# Patient Record
Sex: Male | Born: 1955 | Race: White | Hispanic: No | Marital: Married | Smoking: Never smoker
Health system: Southern US, Community
[De-identification: ages and names within clinical notes are randomized; demographics above are authoritative.]

## PROBLEM LIST (undated history)

## (undated) DIAGNOSIS — H269 Unspecified cataract: Secondary | ICD-10-CM

## (undated) DIAGNOSIS — M1990 Unspecified osteoarthritis, unspecified site: Secondary | ICD-10-CM

## (undated) DIAGNOSIS — T7840XA Allergy, unspecified, initial encounter: Secondary | ICD-10-CM

## (undated) HISTORY — PX: REPLACEMENT TOTAL KNEE: SUR1224

## (undated) HISTORY — DX: Allergy, unspecified, initial encounter: T78.40XA

## (undated) HISTORY — PX: TONSILLECTOMY AND ADENOIDECTOMY: SHX28

## (undated) HISTORY — PX: EYE SURGERY: SHX253

## (undated) HISTORY — PX: CHOLECYSTECTOMY: SHX55

## (undated) HISTORY — PX: JOINT REPLACEMENT: SHX530

## (undated) HISTORY — DX: Unspecified cataract: H26.9

## (undated) HISTORY — PX: CATARACT EXTRACTION: SUR2

## (undated) HISTORY — PX: TONSILLECTOMY: SUR1361

## (undated) HISTORY — DX: Unspecified osteoarthritis, unspecified site: M19.90

---

## 2018-12-28 ENCOUNTER — Ambulatory Visit: Payer: Medicare PPO | Admitting: Podiatry

## 2018-12-29 ENCOUNTER — Ambulatory Visit: Payer: Medicare PPO | Admitting: Podiatry

## 2018-12-29 ENCOUNTER — Ambulatory Visit (INDEPENDENT_AMBULATORY_CARE_PROVIDER_SITE_OTHER): Payer: Medicare PPO

## 2018-12-29 ENCOUNTER — Other Ambulatory Visit: Payer: Self-pay

## 2018-12-29 ENCOUNTER — Encounter: Payer: Self-pay | Admitting: Podiatry

## 2018-12-29 VITALS — BP 127/69 | HR 49

## 2018-12-29 DIAGNOSIS — M76821 Posterior tibial tendinitis, right leg: Secondary | ICD-10-CM

## 2018-12-29 DIAGNOSIS — M2141 Flat foot [pes planus] (acquired), right foot: Secondary | ICD-10-CM | POA: Diagnosis not present

## 2018-12-29 DIAGNOSIS — M722 Plantar fascial fibromatosis: Secondary | ICD-10-CM

## 2018-12-29 DIAGNOSIS — M79671 Pain in right foot: Secondary | ICD-10-CM

## 2018-12-29 DIAGNOSIS — M2142 Flat foot [pes planus] (acquired), left foot: Secondary | ICD-10-CM

## 2018-12-30 ENCOUNTER — Encounter: Payer: Self-pay | Admitting: Podiatry

## 2018-12-30 NOTE — Progress Notes (Signed)
Subjective:  Patient ID: Walter Washington, male    DOB: 03-29-1955,  MRN: [id number]  Chief Complaint  Patient presents with  . Foot Pain    right foot painful, torn tendon diagnosed by physician in [place]    63 y.o. male presents with the above complaint.  Patient states he has had been dealing with this pes planus deformity for a while.  The pain has been worsening when he is ambulating and has been on his feet for a while.  This has been going on for past 20 months is more painful with pressure some of the aggravating factors pressure with much exercise.  He has had a podiatrist in [place] that has not diagnosed him with posterior tibial tendon-itis with a possible interstitial tearing.  He was prescribed brace to help heal but has not helped.  MRI was taken in [place] however that was done 1-1/2 years ago and I will be better if he obtain our own MRI.  He denies any other acute complaints.  Patient has failed multiple conservative therapy in [place] by previous podiatrist including offloading modalities injections over-the-counter orthotics.   Review of Systems: Negative except as noted in the HPI. Denies N/V/F/Ch.  History reviewed. No pertinent past medical history. No current outpatient medications on file.  Social History   Tobacco Use  Smoking Status Never Smoker  Smokeless Tobacco Never Used    No Known Allergies Objective:   Vitals:   12/29/18 1205  BP: 127/69  Pulse: (!) 49   There is no height or weight on file to calculate BMI. Constitutional Well developed. Well nourished.  Vascular Dorsalis pedis pulses palpable bilaterally. Posterior tibial pulses palpable bilaterally. Capillary refill normal to all digits.  No cyanosis or clubbing noted. Pedal hair growth normal.  Neurologic Normal speech. Oriented to person, place, and time. Epicritic sensation to light touch grossly present bilaterally.  Dermatologic Nails well groomed and normal in appearance. No  open wounds. No skin lesions.  Orthopedic:  Pain on palpation at the posterior tibial tendon along the course of the tendon and right at the insertion.  Pain with eversion and inversion active and passive range of motion.  No pain at the Achilles tendon at the peroneal tendon or ATFL ligament.  Upon gait examination to many toe signs deformity noted.  Severe collapse in the arch noted.  Unable to recreate the arch with dorsiflexion of the hallux.  Unable to perform single or double heel raise.  Calcaneus eversion noted bilaterally.   Radiographs: 3 views of skeletally mature adult foot.  There is subchondral sclerosis present at the first MPJ.  Mild arthritic changes noted at the first MPJ.  No osteophytes or loosening noted.  Heel spur noted.  Midfoot arthritis noted.  There is decreasing calcaneal inclination angle increase in talar declination angle increase in [name]'s angle.  Increasing Cuboid abduction angle.  No ossicle or osteophyte noted on the medial aspect of the navicular.  There is a mild exostosis present at the navicular base. Assessment:   1. Pes planus of both feet   2. Posterior tibial tendinitis of right lower extremity   3. Pain in right foot    Plan:  Patient was evaluated and treated and all questions answered.  Right foot posterior tibial tendinitis secondary to posterior tibial tendon dysfunction Given the severity of the pes planus deformity with pain along the posterior tibial tendon and at the insertion on the medial aspect of the navicular, patient will benefit from custom-made orthotics  to help address the rigid nature of the pes planus deformity.  I will need to obtain an MRI of the right ankle to evaluate the posterior tibial tendon and the integrity of it.  Pes planus deformity -I explained to the patient the etiology and all the various treatment options associated with pes planus deformity.  I included the options for reconstructive flatfoot given how rigid and how  painful it has been for him for the past couple of years.  Patient elected to hold off on full reconstruction of the flatfoot deformity and would like to pursue posterior tibial debridement with detachment/reinsertion at the base and plantar aspect of the navicular(Kidner).   I will see him back again in 3 weeks in the meantime patient is scheduled to be seen by [facility] for orthotics and he will obtain an MRI.  I will review the MRI during next visit and discussed further surgical care.

## 2019-01-06 ENCOUNTER — Ambulatory Visit: Payer: Medicare PPO | Admitting: Orthotics

## 2019-01-06 ENCOUNTER — Other Ambulatory Visit: Payer: Self-pay

## 2019-01-06 DIAGNOSIS — M76821 Posterior tibial tendinitis, right leg: Secondary | ICD-10-CM

## 2019-01-06 DIAGNOSIS — M2141 Flat foot [pes planus] (acquired), right foot: Secondary | ICD-10-CM

## 2019-01-06 DIAGNOSIS — M2142 Flat foot [pes planus] (acquired), left foot: Secondary | ICD-10-CM

## 2019-01-06 NOTE — Progress Notes (Signed)
Patient presents with history of PTTD/Pes Planus (acquired).  Patient demonstrated medial shift talus/drop navicular, and medial column collapse.   Plan is for CMFO w/ longitudinal arch support, rear foot stability to address eversion.  Fabrication will be with semi rigid/rigid poly pro shell, deep heel seat, wide, padding under spenco cover. Plan on                                      To fabricate. 

## 2019-01-07 ENCOUNTER — Telehealth: Payer: Self-pay

## 2019-01-07 NOTE — Telephone Encounter (Signed)
MRI approved from 01/04/2019 to 02/03/2019 [facility] # [id number] Patient has been notified of approval and will call scheduling to set up own appt to his convenience.

## 2019-01-07 NOTE — Telephone Encounter (Signed)
-----   Message from Felipa Furnace, DPM sent at 12/29/2018  3:48 PM EDT ----- Regarding: MRI right ankle Hi Angie,Would you be able to order an MRI for right ankle for this patient.  Diagnosis will be right tibialis posterior tendon dysfunction.  Please evaluate for tendon insertion pain for posterior tibial.  Thanks

## 2019-01-14 ENCOUNTER — Other Ambulatory Visit: Payer: Self-pay

## 2019-01-14 DIAGNOSIS — M76821 Posterior tibial tendinitis, right leg: Secondary | ICD-10-CM

## 2019-02-02 ENCOUNTER — Ambulatory Visit: Payer: Medicare PPO | Admitting: Podiatry

## 2019-02-09 ENCOUNTER — Ambulatory Visit
Admission: RE | Admit: 2019-02-09 | Discharge: 2019-02-09 | Disposition: A | Discharge status: Home / Self Care | Payer: Medicare PPO | Source: Ambulatory Visit | Attending: Podiatry | Admitting: Podiatry

## 2019-02-09 ENCOUNTER — Other Ambulatory Visit: Payer: Self-pay

## 2019-02-09 DIAGNOSIS — M76821 Posterior tibial tendinitis, right leg: Secondary | ICD-10-CM | POA: Diagnosis present

## 2019-02-15 ENCOUNTER — Encounter: Payer: Self-pay | Admitting: Podiatry

## 2019-02-15 ENCOUNTER — Ambulatory Visit: Payer: Medicare PPO | Admitting: Podiatry

## 2019-02-15 ENCOUNTER — Other Ambulatory Visit: Payer: Self-pay

## 2019-02-15 DIAGNOSIS — M79672 Pain in left foot: Secondary | ICD-10-CM

## 2019-02-15 DIAGNOSIS — M7752 Other enthesopathy of left foot: Secondary | ICD-10-CM

## 2019-02-15 DIAGNOSIS — M2142 Flat foot [pes planus] (acquired), left foot: Secondary | ICD-10-CM

## 2019-02-15 DIAGNOSIS — M76821 Posterior tibial tendinitis, right leg: Secondary | ICD-10-CM

## 2019-02-15 DIAGNOSIS — M79671 Pain in right foot: Secondary | ICD-10-CM

## 2019-02-15 DIAGNOSIS — M2141 Flat foot [pes planus] (acquired), right foot: Secondary | ICD-10-CM

## 2019-02-15 NOTE — Progress Notes (Signed)
Subjective:  Patient ID: Walter Washington, male    DOB: [birth year],  MRN: [id number]  Chief Complaint  Patient presents with  . Foot Pain    pt is here for foot pain of both feet, pt states that he recently got an MRI and is looking to get results    63 y.o. male presents with the above complaint.  Patient states he has had been dealing with this pes planus deformity for a while.  The pain has been worsening when he is ambulating and has been on his feet for a while.  This has been going on for past 20 months is more painful with pressure some of the aggravating factors pressure with much exercise.  He has had a podiatrist in [place] that has not diagnosed him with posterior tibial tendon-itis with a possible interstitial tearing.  He was prescribed brace to help heal but has not helped.  MRI was taken in [place] however that was done 1-1/2 years ago and I will be better if he obtain our own MRI.  He denies any other acute complaints.  Patient has failed multiple conservative therapy in [place] by previous podiatrist including offloading modalities injections over-the-counter orthotics.   Patient is here to follow-up for an MRI results as well as schedule the surgery to address the posterior tibial tendon dysfunction.  Patient states the pain is still there.  He is also here to pick up his orthotics.  His pain is 6 out of 10.  He also has a secondary complaint of pain to the left heel.  This is an acute onset over the last couple weeks.  It may also be due to compensation mechanism.  He denies any other acute complaints at this time.   Review of Systems: Negative except as noted in the HPI. Denies N/V/F/Ch.  No past medical history on file. No current outpatient medications on file.  Social History   Tobacco Use  Smoking Status Never Smoker  Smokeless Tobacco Never Used    No Known Allergies Objective:   There were no vitals filed for this visit. There is no height or weight on file  to calculate BMI. Constitutional Well developed. Well nourished.  Vascular Dorsalis pedis pulses palpable bilaterally. Posterior tibial pulses palpable bilaterally. Capillary refill normal to all digits.  No cyanosis or clubbing noted. Pedal hair growth normal.  Neurologic Normal speech. Oriented to person, place, and time. Epicritic sensation to light touch grossly present bilaterally.  Dermatologic Nails well groomed and normal in appearance. No open wounds. No skin lesions.  Orthopedic:  Pain on palpation at the posterior tibial tendon along the course of the tendon and right at the insertion.  Pain with eversion and inversion active and passive range of motion.  No pain at the Achilles tendon at the peroneal tendon or ATFL ligament.  Upon gait examination to many toe signs deformity noted.  Severe collapse in the arch noted.  Unable to recreate the arch with dorsiflexion of the hallux.  Unable to perform single or double heel raise.  Calcaneus eversion noted bilaterally.   Radiographs:MRI Right foot IMPRESSION: 1. Expansion and longitudinal irregularity of the tibialis posterior tendon compatible with partial tearing and considerable tendinopathy, correlate clinically in assessing for tibialis posterior dysfunction. 2. Thickening of the adjacent superomedial portion of the spring ligament. Questionable tearing of the medioplantar oblique portion of the spring ligament. 3. There is some flattening of the longitudinal arch of the foot potentially indicating pes planus. The anterior talofibular ligament  is grossly intact. 4. Mild peroneus longus tendinopathy adjacent to the distal calcaneus. Mild common peroneus tendon sheath tenosynovitis. 5. Mild flexor hallucis longus tenosynovitis just proximal to the knot of Henry. 6. 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 7. Chondral thinning in the ankle joint and subtalar joint. Degenerative dorsal midfoot  spurring.  Assessment:   1. Pes planus of both feet   2. Pain in right foot   3. Posterior tibial tendinitis of right lower extremity    Plan:  Patient was evaluated and treated and all questions answered.  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  See above for findings of the MRI.  Given the amount of pain that he is having with MRI to correlate the pain of posterior tibial tendon dysfunction, I believe patient will benefit from a surgical intervention to help address the posterior tibial tendon dysfunction with possible Kidner procedure.  Patient agreed to the procedure and would like to proceed with surgical intervention at this time.  Pes planus deformity -I explained to the patient the etiology and all the various treatment options associated with pes planus deformity.  I included the options for reconstructive flatfoot given how rigid and how painful it has been for him for the past couple of years.  Patient elected to hold off on full reconstruction of the flatfoot deformity and would like to pursue posterior tibial debridement with detachment/reinsertion at the base and plantar aspect of the navicular(Kidner).  -Orthotics was dispensed.  Informed surgical risk consent was reviewed and read aloud to the patient.  I reviewed the films.  I have discussed my findings with the patient in great detail.  I have discussed all risks including but not limited to infection, stiffness, scarring, limp, disability, deformity, damage to blood vessels and nerves, numbness, poor healing, need for braces, arthritis, chronic pain, amputation, death.  All benefits and realistic expectations discussed in great detail.  I have made no promises as to the outcome.  I have provided realistic expectations.  I have offered the patient a 2nd opinion, which they have declined and assured me they preferred to proceed  despite the risks -Cam boot dispensed  Left Baxters nerve entrapment/capsulitis -I believe patient will benefit from a steroid injection to help alleviate the acute inflammation that he is having at the medial hindfoot foot/heel.  Patient's pain does not correlate with plantar fasciitis as the pain is more proximal and more medial. A steroid injection was performed at left medial hindfoot using 1% plain Lidocaine and 10 mg of Kenalog. This was well tolerated.

## 2019-02-17 ENCOUNTER — Other Ambulatory Visit: Payer: Medicare PPO | Admitting: Orthotics

## 2019-02-18 ENCOUNTER — Telehealth: Payer: Self-pay | Admitting: *Deleted

## 2019-02-18 NOTE — Telephone Encounter (Signed)
"  I called [facility] about my surgery.  They said they do not have me scheduled for surgery there.  I was told to give them a call to schedule it.  I saw Dr. Posey Pronto earlier this week."  I can schedule your surgery.  Dr. Posey Pronto does surgeries on Mondays.  Do you have a date that you prefer?  "I'd like to do it on March 15, 2019."  I don't have your paperwork at this time but I'll get it scheduled for 03/15/2019 as soon as I get it from Dr. Posey Pronto.  You need to register online, the instructions are in the brochure that was given to you.  "Oh I see, I'll take care of it.  I'll call back next week to verify that I'm scheduled.  You know how things can sometime fall through the cracks."  It's not necessary for you to call back, I'm his only scheduler.  I'll make sure it is scheduled.  (Please send consent forms.)

## 2019-02-19 NOTE — Telephone Encounter (Signed)
Hi [name],  This is the patient for surgery paperwork.   Thanks  [name]

## 2019-03-12 ENCOUNTER — Telehealth: Payer: Self-pay | Admitting: *Deleted

## 2019-03-12 NOTE — Telephone Encounter (Signed)
DOS 03/15/2019 POSTERIOR TENDON DYSFUNCTION - 14709 AND [facility] ADVANCED TENDON - [id number]  [employer]: Eligibility Date - Jun 03, 2018  [facility] - Outpatient  Insurance Type Preferred Provider Organization (PPO)  SURGERY  0 %  Calendar Year  Out of Pocket (Stop Loss) - Health Benefit Plan Coverage  In Network Individual  Insurance Type Preferred Provider Organization (PPO)  $[id number] Calendar Year    Certificate Information  Reference Number NA Status NO ACTION REQUIRED Message For Member contracts which cover this service, no prior authorization is necessary. Please contact [employer] Customer Service at the number on the back of the Member ID card.

## 2019-03-15 DIAGNOSIS — M25774 Osteophyte, right foot: Secondary | ICD-10-CM

## 2019-03-15 DIAGNOSIS — M65871 Other synovitis and tenosynovitis, right ankle and foot: Secondary | ICD-10-CM

## 2019-03-15 MED ORDER — IBUPROFEN 800 MG PO TABS: 800.0000 mg | ORAL_TABLET | Freq: Four times a day (QID) | ORAL | 1 refills | Status: DC | PRN

## 2019-03-15 MED ORDER — OXYCODONE-ACETAMINOPHEN 10-325 MG PO TABS: 1.0000 | ORAL_TABLET | Freq: Four times a day (QID) | ORAL | 0 refills | Status: AC | PRN

## 2019-03-15 NOTE — Addendum Note (Signed)
Addended by: Nicholes Rough on: 03/15/2019 07:50 AM   Modules accepted: Orders

## 2019-03-23 ENCOUNTER — Ambulatory Visit (INDEPENDENT_AMBULATORY_CARE_PROVIDER_SITE_OTHER): Payer: Medicare PPO | Admitting: Podiatry

## 2019-03-23 ENCOUNTER — Other Ambulatory Visit: Payer: Self-pay

## 2019-03-23 ENCOUNTER — Encounter: Payer: Self-pay | Admitting: Podiatry

## 2019-03-23 DIAGNOSIS — M76821 Posterior tibial tendinitis, right leg: Secondary | ICD-10-CM

## 2019-03-23 DIAGNOSIS — M79671 Pain in right foot: Secondary | ICD-10-CM

## 2019-03-23 NOTE — Progress Notes (Signed)
  Subjective:  Patient ID: Walter Washington, male    DOB: 1955/05/21,  MRN: [id number]  Chief Complaint  Patient presents with  . Routine Post Op     POV#1 DOS 03/15/2019 POSTERIOR TENDON REPAIR AND KIDNER ADVANCED TENDON RT    64 y.o. male returns for post-op check.  Patient states is doing really well.  He is overall very happy with the surgery.  He does not have any pain.  He barely takes any of his pain medication.  He has been nonambulatory with cam boot on.  Patient shows no signs of infection.  He would like to know when we can take the staples out.  He has been keeping his bandages nice dry clean and intact.  Review of Systems: Negative except as noted in the HPI. Denies N/V/F/Ch.  No past medical history on file.  Current Outpatient Medications:  .  ibuprofen (ADVIL) 800 MG tablet, Take 1 tablet (800 mg total) by mouth every 6 (six) hours as needed., Disp: 60 tablet, Rfl: 1 .  oxyCODONE-acetaminophen (PERCOCET) 10-325 MG tablet, Take 1 tablet by mouth every 6 (six) hours as needed for up to 8 days for pain., Disp: 30 tablet, Rfl: 0  Social History   Tobacco Use  Smoking Status Never Smoker  Smokeless Tobacco Never Used    No Known Allergies Objective:  There were no vitals filed for this visit. There is no height or weight on file to calculate BMI. Constitutional Well developed. Well nourished.  Vascular Foot warm and well perfused. Capillary refill normal to all digits.   Neurologic Normal speech. Oriented to person, place, and time. Epicritic sensation to light touch grossly present bilaterally.  Dermatologic Skin healing well without signs of infection. Skin edges well coapted without signs of infection.  Orthopedic: Tenderness to palpation noted about the surgical site.   Radiographs: None Assessment:   1. Posterior tibial tendinitis of right lower extremity   2. Pain in right foot    Plan:  Patient was evaluated and treated and all questions answered.  S/p  foot surgery right -Progressing as expected post-operatively. -XR: None.  I will plan on getting the x-rays during next visit. -WB Status: Nonweightbearing in cam boot -Sutures: Staples are intact without any signs of dehiscence.  No signs of clinical infection noted.  I plan to remove the staples during next visit. -Medications: None -Foot redressed.  Betadine waiting wet-to-dry dressing changes to the incision.  No follow-ups on file.

## 2019-03-29 ENCOUNTER — Ambulatory Visit (INDEPENDENT_AMBULATORY_CARE_PROVIDER_SITE_OTHER): Payer: Medicare PPO | Admitting: Podiatry

## 2019-03-29 ENCOUNTER — Other Ambulatory Visit: Payer: Self-pay

## 2019-03-29 ENCOUNTER — Ambulatory Visit (INDEPENDENT_AMBULATORY_CARE_PROVIDER_SITE_OTHER): Payer: Medicare PPO

## 2019-03-29 ENCOUNTER — Ambulatory Visit: Payer: Medicare PPO

## 2019-03-29 DIAGNOSIS — M79671 Pain in right foot: Secondary | ICD-10-CM

## 2019-03-29 DIAGNOSIS — M76821 Posterior tibial tendinitis, right leg: Secondary | ICD-10-CM

## 2019-03-29 DIAGNOSIS — Z9889 Other specified postprocedural states: Secondary | ICD-10-CM

## 2019-03-30 ENCOUNTER — Encounter: Payer: Self-pay | Admitting: Podiatry

## 2019-03-30 NOTE — Progress Notes (Signed)
  Subjective:  Patient ID: Walter Washington, male    DOB: 10-09-1955,  MRN: [id number]  Chief Complaint  Patient presents with  . Routine Post Op    POV#2 DOS 03/15/2019 POSTERIOR TENDON REPAIR AND [facility] ADVANCED TENDON RT, staples have been removed, pt shows no signs of infection, and has no pain.    64 y.o. male returns for post-op check.  Patient states is doing really well.  He is overall very happy with the surgery.  He does not have any pain.  He barely takes any of his pain medication.  He has been nonambulatory with cam boot on.  Patient shows no signs of infection.    Review of Systems: Negative except as noted in the HPI. Denies N/V/F/Ch.  No past medical history on file.  Current Outpatient Medications:  .  ibuprofen (ADVIL) 800 MG tablet, Take 1 tablet (800 mg total) by mouth every 6 (six) hours as needed., Disp: 60 tablet, Rfl: 1  Social History   Tobacco Use  Smoking Status Never Smoker  Smokeless Tobacco Never Used    No Known Allergies Objective:  There were no vitals filed for this visit. There is no height or weight on file to calculate BMI. Constitutional Well developed. Well nourished.  Vascular Foot warm and well perfused. Capillary refill normal to all digits.   Neurologic Normal speech. Oriented to person, place, and time. Epicritic sensation to light touch grossly present bilaterally.  Dermatologic Skin healing well without signs of infection. Skin edges well coapted without signs of infection.  Staples intact  Orthopedic:  No tenderness to palpation noted about the surgical site.   Radiographs: 2 views of skeletally mature adult foot x-rays were taken: Good correction noted.  Hardware is intact without any signs of loosening.  Postsurgical exostectomy of the navicular was performed. Assessment:   1. Posterior tibial tendinitis of right lower extremity    Plan:  Patient was evaluated and treated and all questions answered.  S/p foot surgery  right -Progressing as expected post-operatively. -XR: None.  I will plan on getting the x-rays during next visit. -WB Status: Nonweightbearing in cam boot -Sutures: Staples were taken out.  No clinical signs of dehiscence noted. -Medications: None -Foot redressed.  Betadine waiting wet-to-dry dressing changes to the incision.  No follow-ups on file.

## 2019-04-11 ENCOUNTER — Ambulatory Visit: Payer: Medicare PPO | Attending: Internal Medicine

## 2019-04-11 DIAGNOSIS — Z23 Encounter for immunization: Secondary | ICD-10-CM | POA: Insufficient documentation

## 2019-04-11 NOTE — Progress Notes (Signed)
   Covid-19 Vaccination Clinic  Name:  Levi Klaiber    MRN: [id number] DOB: [birth year]  04/11/2019  Mr. Decandia was observed post Covid-19 immunization for 15 minutes without incidence. He was provided with Vaccine Information Sheet and instruction to access the V-Safe system.   Mr. Mendiola was instructed to call 911 with any severe reactions post vaccine: Marland Kitchen Difficulty breathing  . Swelling of your face and throat  . A fast heartbeat  . A bad rash all over your body  . Dizziness and weakness    Immunizations Administered    Name Date Dose VIS Date Route   Pfizer COVID-19 Vaccine 04/11/2019  5:18 PM 0.3 mL 02/12/2019 Intramuscular   Manufacturer: [employer], [employer]   Lot: [id number]   NDC: [address]-2

## 2019-04-12 ENCOUNTER — Ambulatory Visit (INDEPENDENT_AMBULATORY_CARE_PROVIDER_SITE_OTHER): Payer: Medicare PPO

## 2019-04-12 ENCOUNTER — Ambulatory Visit (INDEPENDENT_AMBULATORY_CARE_PROVIDER_SITE_OTHER): Payer: Medicare PPO | Admitting: Podiatry

## 2019-04-12 ENCOUNTER — Other Ambulatory Visit: Payer: Self-pay

## 2019-04-12 DIAGNOSIS — M76821 Posterior tibial tendinitis, right leg: Secondary | ICD-10-CM | POA: Diagnosis not present

## 2019-04-12 DIAGNOSIS — Z9889 Other specified postprocedural states: Secondary | ICD-10-CM

## 2019-04-13 ENCOUNTER — Encounter: Payer: Self-pay | Admitting: Podiatry

## 2019-04-13 NOTE — Progress Notes (Signed)
  Subjective:  Patient ID: Walter Washington, male    DOB: 1955/03/16,  MRN: [id number]  Chief Complaint  Patient presents with  . Routine Post Op    POV#3 DOS 03/15/2019 POSTERIOR TENDON REPAIR AND [facility] ADVANCED TENDON RT, pt shows no signs of infection, pt also states that he is doing a lot better and is looking to get out of the boot.    64 y.o. male returns for post-op check.  Patient states is doing really well.  He is overall very happy with the surgery.  He does not have any pain.  He barely takes any of his pain medication.  He has been n weightbearing with crutches.  He denies any other acute complaints.  He does not have any pain at this time.  Review of Systems: Negative except as noted in the HPI. Denies N/V/F/Ch.  No past medical history on file.  Current Outpatient Medications:  .  ibuprofen (ADVIL) 800 MG tablet, Take 1 tablet (800 mg total) by mouth every 6 (six) hours as needed., Disp: 60 tablet, Rfl: 1  Social History   Tobacco Use  Smoking Status Never Smoker  Smokeless Tobacco Never Used    No Known Allergies Objective:  There were no vitals filed for this visit. There is no height or weight on file to calculate BMI. Constitutional Well developed. Well nourished.  Vascular Foot warm and well perfused. Capillary refill normal to all digits.   Neurologic Normal speech. Oriented to person, place, and time. Epicritic sensation to light touch grossly present bilaterally.  Dermatologic  skin completely reepithelialized.  No pain on palpation.  Orthopedic:  No tenderness to palpation noted about the surgical site.   Radiographs: 2 views of skeletally mature adult foot x-rays were taken: Good correction noted.  Hardware is intact without any signs of loosening.  Postsurgical exostectomy of the navicular was performed. Assessment:   1. Posterior tibial tendinitis of right lower extremity   2. Status post foot surgery    Plan:  Patient was evaluated and treated  and all questions answered.  S/p foot surgery right -Progressing as expected post-operatively. -XR: See above -WB Status: Weightbearing as tolerated in cam boot. -Sutures: Staples were taken out.  No clinical signs of dehiscence noted. -Medications: None -I have asked the patient to start ambulating with a cam boot on as he is almost 4 weeks out.  He has also been healing at a faster rate than normal therefore I feel comfortable allowing the patient to start weightbearing slowly with a cam boot on.  I will plan on transitioning the patient to surgical shoe during next visit.  Patient states that if he is doing well overall he may transition himself into a surgical shoe.  I have asked the patient to do so very carefully and if there is any pain associated I would like for him to place himself back in the cam boot.  He states understanding and will do so  No follow-ups on file.

## 2019-05-02 ENCOUNTER — Ambulatory Visit: Payer: Medicare PPO

## 2019-05-04 ENCOUNTER — Other Ambulatory Visit: Payer: Self-pay

## 2019-05-04 ENCOUNTER — Ambulatory Visit (INDEPENDENT_AMBULATORY_CARE_PROVIDER_SITE_OTHER): Payer: Medicare PPO | Admitting: Podiatry

## 2019-05-04 DIAGNOSIS — M79671 Pain in right foot: Secondary | ICD-10-CM

## 2019-05-04 DIAGNOSIS — M76821 Posterior tibial tendinitis, right leg: Secondary | ICD-10-CM

## 2019-05-04 DIAGNOSIS — Z9889 Other specified postprocedural states: Secondary | ICD-10-CM

## 2019-05-05 ENCOUNTER — Telehealth: Payer: Self-pay

## 2019-05-05 ENCOUNTER — Encounter: Payer: Self-pay | Admitting: Podiatry

## 2019-05-05 DIAGNOSIS — Z9889 Other specified postprocedural states: Secondary | ICD-10-CM

## 2019-05-05 DIAGNOSIS — M76821 Posterior tibial tendinitis, right leg: Secondary | ICD-10-CM

## 2019-05-05 NOTE — Telephone Encounter (Signed)
-----   Message from Candelaria Stagers, DPM sent at 05/05/2019  7:55 AM EST ----- Regarding: Physical therapy right Hi Angie,  Can you schedule this patient to undergo physical therapy anywhere locally in [place]. Patient needs general conditioning to the surgical foot on the right side as well as gait training.  Let me know if you need anything else thanks

## 2019-05-05 NOTE — Progress Notes (Signed)
  Subjective:  Patient ID: Walter Washington, male    DOB: 1955-10-26,  MRN: [id number]  Chief Complaint  Patient presents with  . Routine Post Op    POV#3 DOS 03/15/2019 POSTERIOR TENDON REPAIR AND [facility] ADVANCED TENDON RT, pt states that he is still feeling pain since the last time he was here, pain is now located on the bottom of the arche    64 y.o. male returns for post-op check. Patient has had a hard time difficulty transitioning to regular sneakers. Patient states that he has pain not along the incision or the surgery however in the bottom of the foot. Patient has been ambulating with a cam boot and has attempted to transition into sneakers. However after some period of time the pain comes back in the arch of the foot and has to place himself back in the boot. He denies any other acute complaints. He would like to know if there is anything that can be done for this.  Review of Systems: Negative except as noted in the HPI. Denies N/V/F/Ch.  No past medical history on file.  Current Outpatient Medications:  .  ibuprofen (ADVIL) 800 MG tablet, Take 1 tablet (800 mg total) by mouth every 6 (six) hours as needed., Disp: 60 tablet, Rfl: 1  Social History   Tobacco Use  Smoking Status Never Smoker  Smokeless Tobacco Never Used    No Known Allergies Objective:  There were no vitals filed for this visit. There is no height or weight on file to calculate BMI. Constitutional Well developed. Well nourished.  Vascular Foot warm and well perfused. Capillary refill normal to all digits.   Neurologic Normal speech. Oriented to person, place, and time. Epicritic sensation to light touch grossly present bilaterally.  Dermatologic  skin completely reepithelialized.  No pain on palpation. Pain along the arch of the foot mild on palpation at this time.  Orthopedic:  No tenderness to palpation noted about the surgical site.   Radiographs: None Assessment:   1. Status post foot surgery     2. Posterior tibial tendinitis of right lower extremity   3. Pain in right foot    Plan:  Patient was evaluated and treated and all questions answered.  S/p foot surgery right -Progressing as expected post-operatively. -XR: See above -WB Status: Weightbearing as tolerated in cam boot. -Sutures: Staples were taken out.  No clinical signs of dehiscence noted. -Medications: None -Patient has started transitioning into a flat shoe however he is not able to do so completely. Patient states that there is pain associated with it once he comes out of the cam boot. When he is in the cam boot he does not have any pain. However once he comes out of the cam boot he has a lot of pain in the arch of the foot. Patient states that he would like to know if there is doing something wrong if there is any kind of altercation that we can address. I have asked the patient to slowly start progressing into regular sneakers with orthotics. I have asked him to not go 100% in the orthotics and begin transitioning/breaking the orthotics in. Patient states understanding and will start doing that. -I also believe patient will benefit from physical therapy to help address the overall deconditioning of the right lower extremity as well as gait training. Patient states understanding and agrees with the plan  No follow-ups on file.

## 2019-05-05 NOTE — Telephone Encounter (Signed)
Referral has been entered in chart for PT at [facility]

## 2019-05-06 ENCOUNTER — Ambulatory Visit: Payer: Medicare PPO | Attending: Internal Medicine

## 2019-05-06 DIAGNOSIS — Z23 Encounter for immunization: Secondary | ICD-10-CM | POA: Insufficient documentation

## 2019-05-06 NOTE — Progress Notes (Signed)
   Covid-19 Vaccination Clinic  Name:  Shalamar Crays    MRN: [id number] DOB: Jan 13, 1956  05/06/2019  Mr. Thane was observed post Covid-19 immunization for 15 minutes without incident. He was provided with Vaccine Information Sheet and instruction to access the V-Safe system.   Mr. Zentner was instructed to call 911 with any severe reactions post vaccine: Marland Kitchen Difficulty breathing  . Swelling of face and throat  . A fast heartbeat  . A bad rash all over body  . Dizziness and weakness   Immunizations Administered    Name Date Dose VIS Date Route   Pfizer COVID-19 Vaccine 05/06/2019  2:08 PM 0.3 mL 02/12/2019 Intramuscular   Manufacturer: [employer], [employer]   Lot: [id number]   NDC: [address]-2

## 2019-05-26 ENCOUNTER — Ambulatory Visit: Payer: Medicare PPO

## 2019-06-01 ENCOUNTER — Encounter: Payer: Self-pay | Admitting: Podiatry

## 2019-06-01 ENCOUNTER — Ambulatory Visit (INDEPENDENT_AMBULATORY_CARE_PROVIDER_SITE_OTHER): Payer: Medicare PPO | Admitting: Podiatry

## 2019-06-01 ENCOUNTER — Other Ambulatory Visit: Payer: Self-pay

## 2019-06-01 DIAGNOSIS — M79671 Pain in right foot: Secondary | ICD-10-CM

## 2019-06-01 DIAGNOSIS — M76821 Posterior tibial tendinitis, right leg: Secondary | ICD-10-CM

## 2019-06-01 DIAGNOSIS — Z9889 Other specified postprocedural states: Secondary | ICD-10-CM

## 2019-06-01 NOTE — Progress Notes (Signed)
  Subjective:  Patient ID: Walter Washington, male    DOB: 03-23-1955,  MRN: [id number]  Chief Complaint  Patient presents with  . Routine Post Op    OV#5 DOS 03/15/2019 POSTERIOR TENDON REPAIR AND [facility] ADVANCED TENDON RT, pt is doing a lot better, pt puts pain scale as a 2 out of 53    64 y.o. male returns for post-op check.   Patient states that he is doing a lot better.  He is pain is scale is 2 out of 10 at its worst.  Patient states the incision has completely healed.  He stopped going to physical therapy as he got as much as he could out of it.  He denies any other acute complaints.  He would like to know if he can go back to playing golf as well as continue his daily activities.  Review of Systems: Negative except as noted in the HPI. Denies N/V/F/Ch.  No past medical history on file.  Current Outpatient Medications:  .  ibuprofen (ADVIL) 800 MG tablet, Take 1 tablet (800 mg total) by mouth every 6 (six) hours as needed., Disp: 60 tablet, Rfl: 1 .  predniSONE (DELTASONE) 50 MG tablet, , Disp: , Rfl:   Social History   Tobacco Use  Smoking Status Never Smoker  Smokeless Tobacco Never Used    No Known Allergies Objective:  There were no vitals filed for this visit. There is no height or weight on file to calculate BMI. Constitutional Well developed. Well nourished.  Vascular Foot warm and well perfused. Capillary refill normal to all digits.   Neurologic Normal speech. Oriented to person, place, and time. Epicritic sensation to light touch grossly present bilaterally.  Dermatologic  skin completely reepithelialized.  No pain on palpation. Now pain along the arch of the foot mild on palpation at this time.  Orthopedic:  No tenderness to palpation noted about the surgical site.   Radiographs: None Assessment:   1. Status post foot surgery   2. Posterior tibial tendinitis of right lower extremity   3. Pain in right foot    Plan:  Patient was evaluated and treated and  all questions answered.  S/p foot surgery right -Progressing as expected post-operatively. -XR: See above -WB Status: Weightbearing as tolerated in cam boot. -Sutures: Staples were taken out.  No clinical signs of dehiscence noted. -Medications: None -Patient has clinically improved drastically.  Patient has been ambulating in regular sneakers without any pain.  Patient has been wearing his orthotics which appear to be comfortable and controlling the motion and supporting the arch of the foot.  Patient can resume normal activities without any restrictions at this time.  I have discussed with the patient that if there is any foot and ankle issues arises in the future come back and see me.  No follow-ups on file.

## 2019-12-30 ENCOUNTER — Other Ambulatory Visit: Payer: Self-pay

## 2019-12-30 ENCOUNTER — Ambulatory Visit: Payer: Medicare PPO | Admitting: Podiatry

## 2019-12-30 ENCOUNTER — Encounter: Payer: Self-pay | Admitting: Podiatry

## 2019-12-30 ENCOUNTER — Ambulatory Visit (INDEPENDENT_AMBULATORY_CARE_PROVIDER_SITE_OTHER): Payer: Medicare PPO

## 2019-12-30 DIAGNOSIS — M7732 Calcaneal spur, left foot: Secondary | ICD-10-CM

## 2019-12-30 DIAGNOSIS — M722 Plantar fascial fibromatosis: Secondary | ICD-10-CM | POA: Diagnosis not present

## 2019-12-30 NOTE — Progress Notes (Signed)
  Subjective:  Patient ID: Walter Washington, male    DOB: 09/23/1955,  MRN: [id number]  Chief Complaint  Patient presents with  . Foot Pain    Patient presents today for left heel pain x 1 year off and on, but has become worse over the past several months.      64 y.o. male presents with the above complaint.  Patient presents with complaint of left heel pain that has been going on for about a year on and off.  Patient had injections in the past which helped a lot.  He states it started to act back up again.  He would like to know if he could do another injection as well as discuss treatments for plantar fasciitis to the left side.  His right foot is healing well and does not have much pain.  He has been ambulating with orthotics he denies any other acute complaints.   Review of Systems: Negative except as noted in the HPI. Denies N/V/F/Ch.  No past medical history on file.  Current Outpatient Medications:  .  naproxen (NAPROSYN) 500 MG tablet, Take 500 mg by mouth 2 (two) times daily with a meal., Disp: , Rfl:   Social History   Tobacco Use  Smoking Status Never Smoker  Smokeless Tobacco Never Used    No Known Allergies Objective:  There were no vitals filed for this visit. There is no height or weight on file to calculate BMI. Constitutional Well developed. Well nourished.  Vascular Dorsalis pedis pulses palpable bilaterally. Posterior tibial pulses palpable bilaterally. Capillary refill normal to all digits.  No cyanosis or clubbing noted. Pedal hair growth normal.  Neurologic Normal speech. Oriented to person, place, and time. Epicritic sensation to light touch grossly present bilaterally.  Dermatologic Nails well groomed and normal in appearance. No open wounds. No skin lesions.  Orthopedic: Normal joint ROM without pain or crepitus bilaterally. No visible deformities. Tender to palpation at the calcaneal tuber left. No pain with calcaneal squeeze left. Ankle ROM  diminished range of motion left. Silfverskiold Test: positive left.   Radiographs: Taken and reviewed. No acute fractures or dislocations. No evidence of stress fracture.  Plantar heel spur present. Posterior heel spur present.   Assessment:   1. Plantar fasciitis of left foot   2. Heel spur, left    Plan:  Patient was evaluated and treated and all questions answered.  Plantar Fasciitis, left - XR reviewed as above.  - Educated on icing and stretching. Instructions given.  - Injection delivered to the plantar fascia as below. - DME: Plantar Fascial Brace - Pharmacologic management: None  Procedure: Injection Tendon/Ligament Location: Left plantar fascia at the glabrous junction; medial approach. Skin Prep: alcohol Injectate: 0.5 cc 0.5% marcaine plain, 0.5 cc of 1% Lidocaine, 0.5 cc kenalog 10. Disposition: Patient tolerated procedure well. Injection site dressed with a band-aid.  No follow-ups on file.

## 2020-02-01 ENCOUNTER — Ambulatory Visit: Payer: Medicare PPO | Admitting: Podiatry

## 2020-02-08 ENCOUNTER — Encounter: Payer: Self-pay | Admitting: Podiatry

## 2020-02-08 ENCOUNTER — Other Ambulatory Visit: Payer: Self-pay

## 2020-02-08 ENCOUNTER — Ambulatory Visit (INDEPENDENT_AMBULATORY_CARE_PROVIDER_SITE_OTHER): Payer: Medicare PPO | Admitting: Podiatry

## 2020-02-08 DIAGNOSIS — M722 Plantar fascial fibromatosis: Secondary | ICD-10-CM | POA: Diagnosis not present

## 2020-02-08 NOTE — Progress Notes (Signed)
  Subjective:  Patient ID: Walter Washington, male    DOB: [birth year],  MRN: [id number]  Chief Complaint  Patient presents with  . Plantar Fasciitis    "its doing alot better"    64 y.o. male presents with the above complaint.  Patient presents with follow-up of left plantar fasciitis.  Patient states that he is 98% improved.  He states the injection worked well.  The bracing is doing really well.  He denies any other acute complaints.  He would like to know if he can have a brace for the right side.   Review of Systems: Negative except as noted in the HPI. Denies N/V/F/Ch.  History reviewed. No pertinent past medical history.  Current Outpatient Medications:  .  naproxen (NAPROSYN) 500 MG tablet, Take 500 mg by mouth 2 (two) times daily with a meal., Disp: , Rfl:   Social History   Tobacco Use  Smoking Status Never Smoker  Smokeless Tobacco Never Used    No Known Allergies Objective:  There were no vitals filed for this visit. There is no height or weight on file to calculate BMI. Constitutional Well developed. Well nourished.  Vascular Dorsalis pedis pulses palpable bilaterally. Posterior tibial pulses palpable bilaterally. Capillary refill normal to all digits.  No cyanosis or clubbing noted. Pedal hair growth normal.  Neurologic Normal speech. Oriented to person, place, and time. Epicritic sensation to light touch grossly present bilaterally.  Dermatologic Nails well groomed and normal in appearance. No open wounds. No skin lesions.  Orthopedic: Normal joint ROM without pain or crepitus bilaterally. No visible deformities. No tender to palpation at the calcaneal tuber left. No pain with calcaneal squeeze left. Ankle ROM diminished range of motion left. Silfverskiold Test: positive left.   Radiographs: Taken and reviewed. No acute fractures or dislocations. No evidence of stress fracture.  Plantar heel spur present. Posterior heel spur present.   Assessment:   1.  Plantar fasciitis of left foot    Plan:  Patient was evaluated and treated and all questions answered.  Plantar Fasciitis, left - Clinically resolved with plantar fascial brace and steroid injection.  Patient states that he is doing a lot better given that his pain is completely resolved I discussed with him to continue losing orthotics and can wean off the brace as needed.  He states understanding and will do so. -I will see him back if any other foot and ankle issues arise in the future.  No follow-ups on file.

## 2020-06-27 ENCOUNTER — Ambulatory Visit: Payer: Medicare PPO | Admitting: Podiatry

## 2020-06-27 ENCOUNTER — Encounter: Payer: Self-pay | Admitting: Podiatry

## 2020-06-27 ENCOUNTER — Other Ambulatory Visit: Payer: Self-pay

## 2020-06-27 DIAGNOSIS — M7730 Calcaneal spur, unspecified foot: Secondary | ICD-10-CM | POA: Diagnosis not present

## 2020-06-27 DIAGNOSIS — M722 Plantar fascial fibromatosis: Secondary | ICD-10-CM | POA: Diagnosis not present

## 2020-06-27 NOTE — Progress Notes (Signed)
  Subjective:  Patient ID: Walter Washington, male    DOB: [birth year],  MRN: [id number]  Chief Complaint  Patient presents with  . Plantar Fasciitis    Patient presents today for flare up plantar fasciitis bilat heels x 1-2 months   He requests injections bilat    65 y.o. male presents with the above complaint.  Patient presents for follow-up of bilateral plantar fasciitis left being greater than right side.  Patient states it recurred again.  He states the injection does last him a good amount of time he would like to do another injection.  He denies any other acute complaints.  Review of Systems: Negative except as noted in the HPI. Denies N/V/F/Ch.  History reviewed. No pertinent past medical history.  Current Outpatient Medications:  .  naproxen (NAPROSYN) 500 MG tablet, Take 500 mg by mouth 2 (two) times daily with a meal., Disp: , Rfl:   Social History   Tobacco Use  Smoking Status Never Smoker  Smokeless Tobacco Never Used    No Known Allergies Objective:  There were no vitals filed for this visit. There is no height or weight on file to calculate BMI. Constitutional Well developed. Well nourished.  Vascular Dorsalis pedis pulses palpable bilaterally. Posterior tibial pulses palpable bilaterally. Capillary refill normal to all digits.  No cyanosis or clubbing noted. Pedal hair growth normal.  Neurologic Normal speech. Oriented to person, place, and time. Epicritic sensation to light touch grossly present bilaterally.  Dermatologic Nails well groomed and normal in appearance. No open wounds. No skin lesions.  Orthopedic: Normal joint ROM without pain or crepitus bilaterally. No visible deformities. Tender to palpation at the calcaneal tuber bilateral No pain with calcaneal squeeze bilateral Ankle ROM diminished range of motion l bilateral Silfverskiold Test: positive bilateral   Radiographs: Taken and reviewed. No acute fractures or dislocations. No evidence of  stress fracture.  Plantar heel spur present. Posterior heel spur present.   Assessment:   1. Plantar fasciitis of left foot   2. Heel spur, unspecified laterality   3. Plantar fasciitis of right foot    Plan:  Patient was evaluated and treated and all questions answered.  Plantar Fasciitis, bilateral with underlying heel spur - XR reviewed as above.  - Educated on icing and stretching. Instructions given.  - Injection delivered to the plantar fascia as below. - DME: Continue wearing plantar Fascial Brace - Pharmacologic management: None  Procedure: Injection Tendon/Ligament Location: Bilateral plantar fascia at the glabrous junction; medial approach. Skin Prep: alcohol Injectate: 0.5 cc 0.5% marcaine plain, 0.5 cc of 1% Lidocaine, 0.5 cc kenalog 10. Disposition: Patient tolerated procedure well. Injection site dressed with a band-aid.  No follow-ups on file.

## 2021-06-16 IMAGING — MR MR ANKLE*R* W/O CM
5 series · 40 of 40 positions shown · non-contrast
Comparison: Radiographs from 12/29/2018

CLINICAL DATA: Popping injury of the ankle well on a treadmill over
1 year ago. Worsening medial ankle pain.

EXAM:
MRI OF THE RIGHT ANKLE WITHOUT CONTRAST
TECHNIQUE: Multiplanar, multisequence MR imaging of the ankle was performed. No
intravenous contrast was administered.

[Series 3: PD fat-sat · axial · right · 3.0mm · 0.50mm/px · z∈[-132,+8]mm · 10 of 36 slices shown]
[im 1/36]
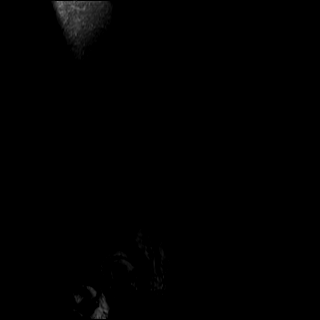
[im 4/36]
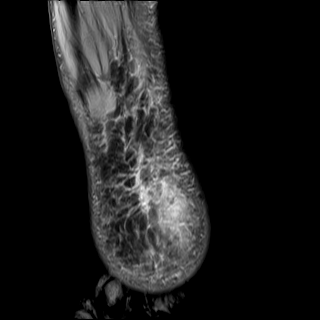
[im 8/36]
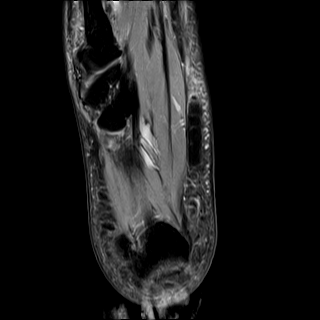
[im 12/36]
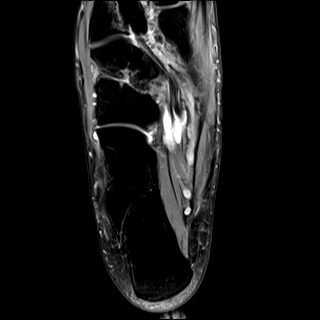
[im 16/36]
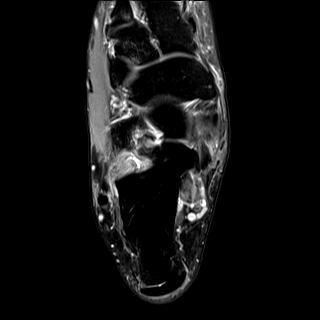
[im 20/36]
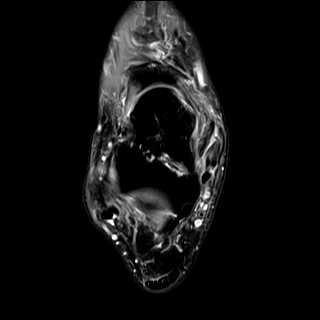
[im 24/36]
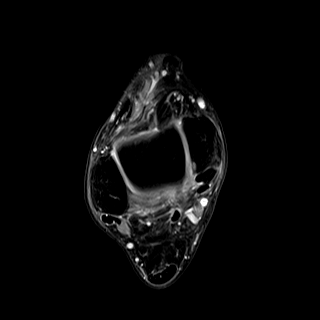
[im 28/36]
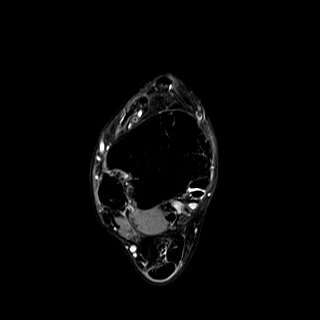
[im 32/36]
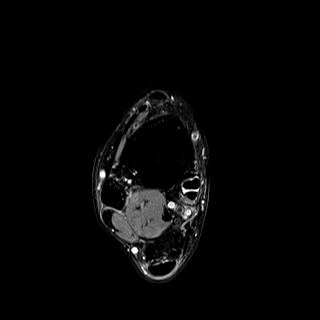
[im 36/36]
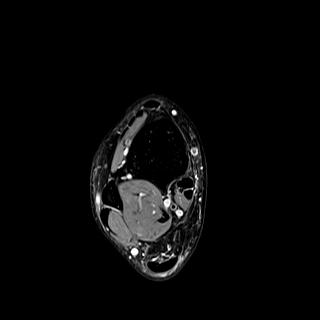

[Series 4: T2 fat-sat · axial · right · 3.0mm · 0.50mm/px · z∈[-132,+8]mm · 9 of 36 slices shown (1 of 2)]
[im 1/36]
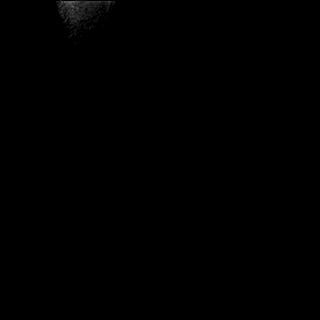
[im 5/36]
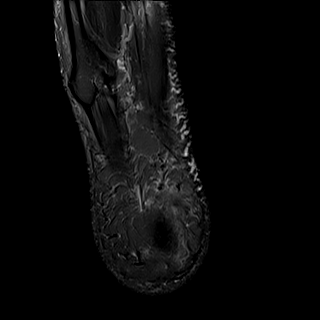
[im 9/36]
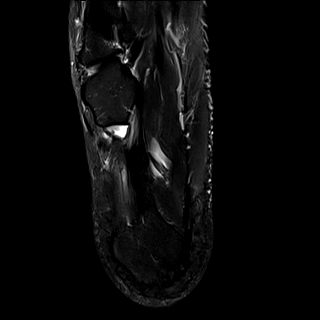
[im 14/36]
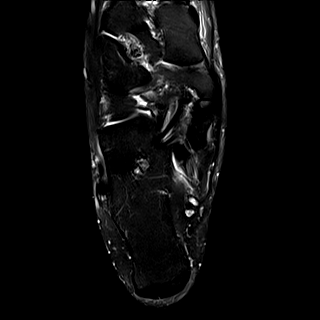
[im 18/36]
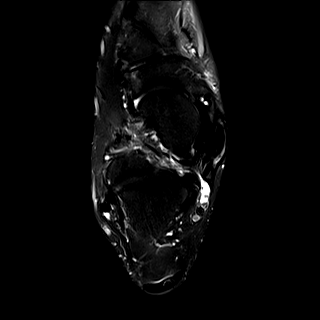
[im 22/36]
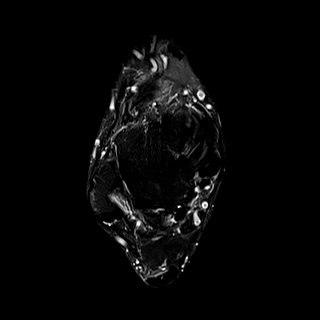
[im 27/36]
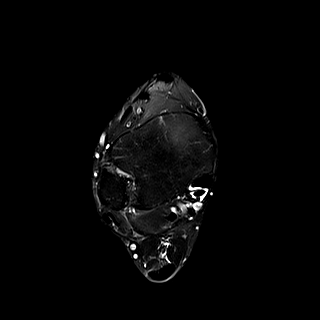
[im 31/36]
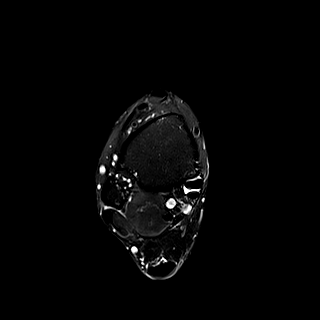
[im 36/36]
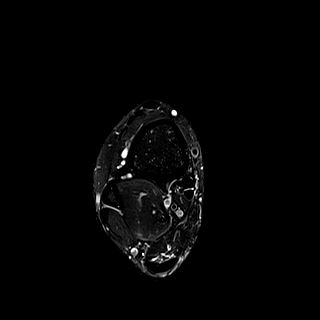

[Series 5: T2 fat-sat · coronal · right · 3.0mm · 0.62mm/px · 11 of 40 slices shown (2 of 2)]
[im 1/40]
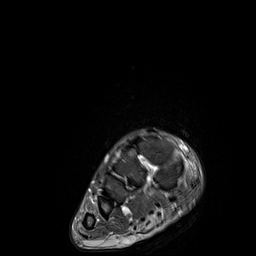
[im 4/40]
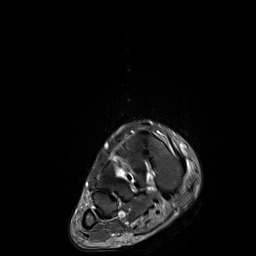
[im 8/40]
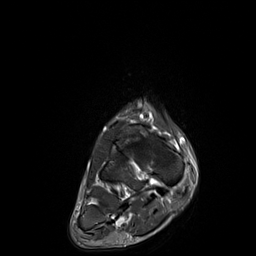
[im 12/40]
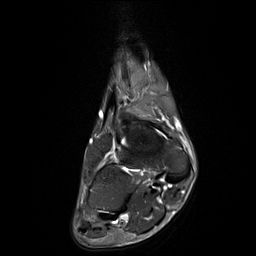
[im 16/40]
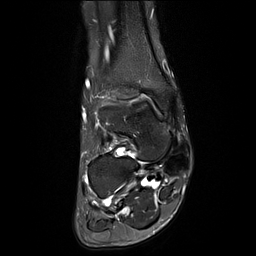
[im 20/40]
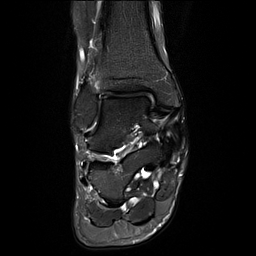
[im 24/40]
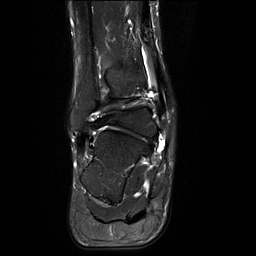
[im 28/40]
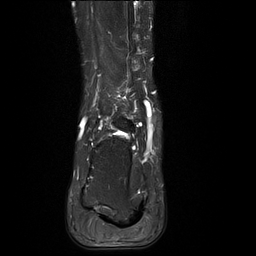
[im 32/40]
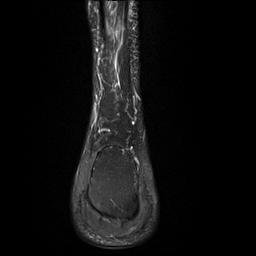
[im 36/40]
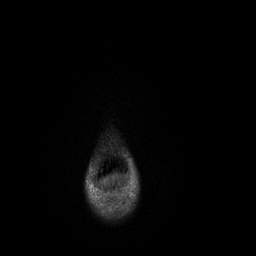
[im 40/40]
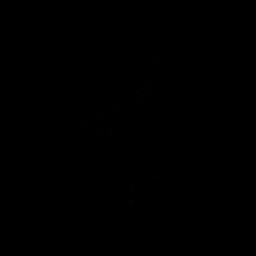

[Series 6: T1 · sagittal · right · 4.0mm · 0.70mm/px · 5 of 20 slices shown]
[im 1/20]
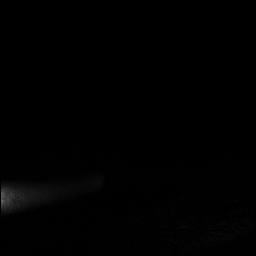
[im 5/20]
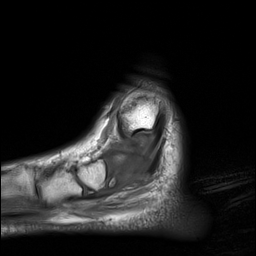
[im 10/20]
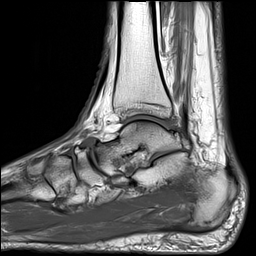
[im 15/20]
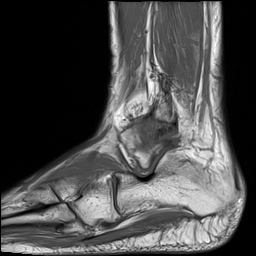
[im 20/20]
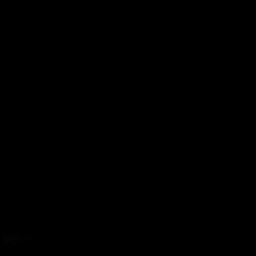

[Series 7: STIR · sagittal · right · 4.0mm · 0.35mm/px · 5 of 20 slices shown]
[im 1/20]
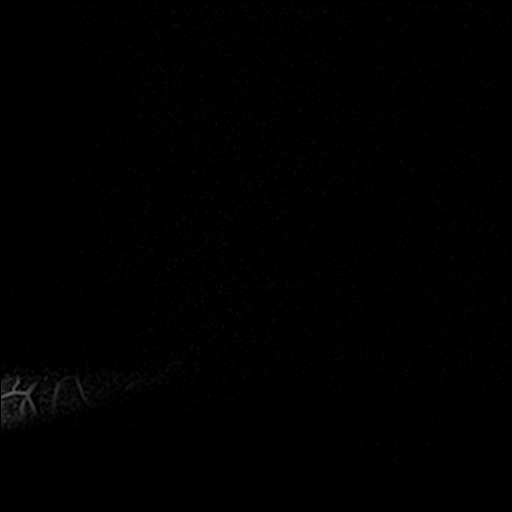
[im 5/20]
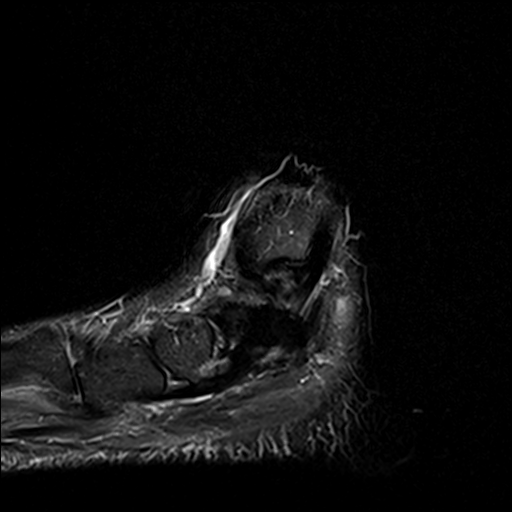
[im 10/20]
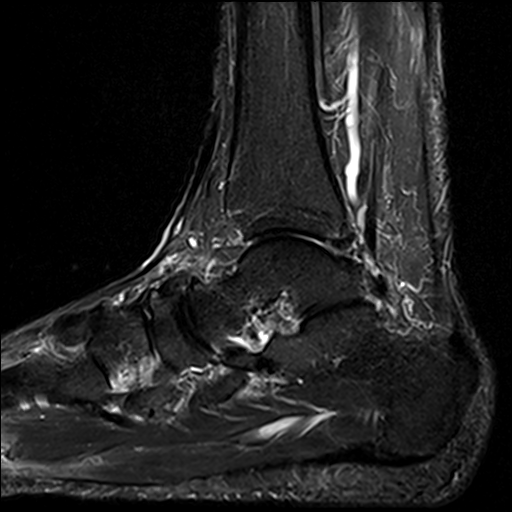
[im 15/20]
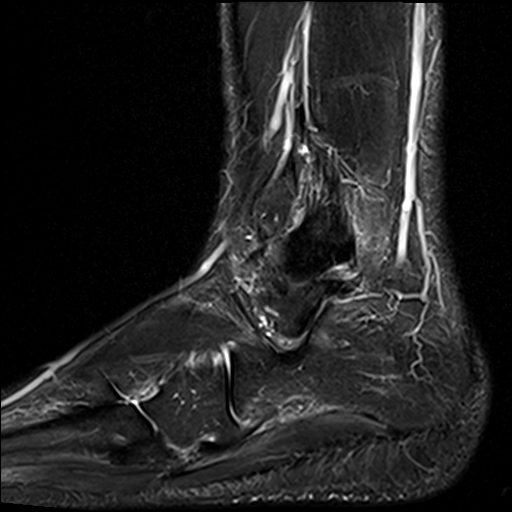
[im 20/20]
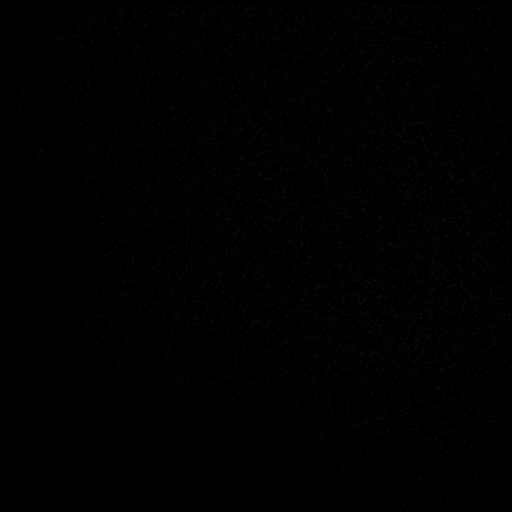

[40 of 40 positions shown; findings below may reference images not displayed]

FINDINGS: TENDONS

Peroneal: Common peroneus tendon sheath tenosynovitis with mild
peroneus longus tendinopathy adjacent to the distal calcaneus.

Posteromedial: Expansion and longitudinal irregularity in the
tibialis posterior tendon posterior to and below the medial
malleolus compatible with partial tearing. Distal tibialis posterior
tendinopathy with accentuated T2 signal and expansion of the distal
tendon. Type 1 accessory navicular.

Mild flexor hallucis longus tenosynovitis just proximal to the knot
of Henry.

Anterior: Unremarkable

Achilles: Unremarkable

Plantar Fascia: Borderline thickening of the medial band of the
plantar fascia without surrounding edema.

LIGAMENTS

Lateral: Borderline thickening of the inferior tibiofibular
ligaments. The lateral ligamentous plot complex appears grossly
intact.

Medial: Thickened tibionavicular and tibiospring components of the
deltoid ligament. Thickened superomedial portion of the spring
ligament. On image [DATE] there is mild prominence of the recess
between the medioplantar oblique and inferoplantar longitudinal
portions of the spring ligament which could conceivably reflect a
medioplantar oblique component tear.

CARTILAGE

Ankle Joint: Chondral thinning. Small focus of endosteal edema
posteriorly along the tibial plafond on image [DATE]. The talar dome
appears intact. No significant joint effusion.

Subtalar Joints/Sinus Tarsi: Mild degenerative chondral thinning.

Bones: Dorsal midfoot spurring. There is some flattening of the
longitudinal arch of the foot.

Other: No supplemental non-categorized findings.
IMPRESSION: 1. Expansion and longitudinal irregularity of the tibialis posterior
tendon compatible with partial tearing and considerable
tendinopathy, correlate clinically in assessing for tibialis
posterior dysfunction.
2. Thickening of the adjacent superomedial portion of the spring
ligament. Questionable tearing of the medioplantar oblique portion
of the spring ligament.
3. There is some flattening of the longitudinal arch of the foot
potentially indicating pes planus. The anterior talofibular ligament
is grossly intact.
4. Mild peroneus longus tendinopathy adjacent to the distal
calcaneus. Mild common peroneus tendon sheath tenosynovitis.
5. Mild flexor hallucis longus tenosynovitis just proximal to the
knot of Aujla.
6. Small type 1 accessory navicular without marrow edema.
7. Chondral thinning in the ankle joint and subtalar joint.
Degenerative dorsal midfoot spurring.

## 2021-10-23 ENCOUNTER — Encounter: Payer: Self-pay | Admitting: Nurse Practitioner

## 2021-12-27 ENCOUNTER — Encounter: Payer: Self-pay | Admitting: Nurse Practitioner

## 2021-12-27 ENCOUNTER — Ambulatory Visit: Payer: Medicare PPO | Admitting: Nurse Practitioner

## 2021-12-27 VITALS — BP 108/62 | HR 51 | Temp 98.6°F | Ht 73.25 in | Wt 226.0 lb

## 2021-12-27 DIAGNOSIS — R351 Nocturia: Secondary | ICD-10-CM | POA: Diagnosis not present

## 2021-12-27 DIAGNOSIS — Z23 Encounter for immunization: Secondary | ICD-10-CM

## 2021-12-27 DIAGNOSIS — R202 Paresthesia of skin: Secondary | ICD-10-CM

## 2021-12-27 DIAGNOSIS — Z0000 Encounter for general adult medical examination without abnormal findings: Secondary | ICD-10-CM

## 2021-12-27 DIAGNOSIS — Z125 Encounter for screening for malignant neoplasm of prostate: Secondary | ICD-10-CM | POA: Diagnosis not present

## 2021-12-27 DIAGNOSIS — E663 Overweight: Secondary | ICD-10-CM

## 2021-12-27 DIAGNOSIS — Z1211 Encounter for screening for malignant neoplasm of colon: Secondary | ICD-10-CM | POA: Insufficient documentation

## 2021-12-27 LAB — COMPREHENSIVE METABOLIC PANEL
ALT: 16 U/L (ref 0–53)
AST: 16 U/L (ref 0–37)
Albumin: 4.3 g/dL (ref 3.5–5.2)
Alkaline Phosphatase: 63 U/L (ref 39–117)
BUN: 22 mg/dL (ref 6–23)
CO2: 29 mEq/L (ref 19–32)
Calcium: 9.3 mg/dL (ref 8.4–10.5)
Chloride: 104 mEq/L (ref 96–112)
Creatinine, Ser: 0.96 mg/dL (ref 0.40–1.50)
GFR: 81.2 mL/min (ref >60.00)
Glucose, Bld: 91 mg/dL (ref 70–99)
Potassium: 4.5 mEq/L (ref 3.5–5.1)
Sodium: 139 mEq/L (ref 135–145)
Total Bilirubin: 0.8 mg/dL (ref 0.2–1.2)
Total Protein: 6.4 g/dL (ref 6.0–8.3)

## 2021-12-27 LAB — CBC
HCT: 45.4 % (ref 39.0–52.0)
Hemoglobin: 15.3 g/dL (ref 13.0–17.0)
MCHC: 33.7 g/dL (ref 30.0–36.0)
MCV: 95.4 fl (ref 78.0–100.0)
Platelets: 185 10*3/uL (ref 150.0–400.0)
RBC: 4.76 Mil/uL (ref 4.22–5.81)
RDW: 13.4 % (ref 11.5–15.5)
WBC: 3.9 10*3/uL — ABNORMAL LOW (ref 4.0–10.5)

## 2021-12-27 LAB — VITAMIN B12: Vitamin B-12: 240 pg/mL (ref 211–911)

## 2021-12-27 LAB — LIPID PANEL
Cholesterol: 130 mg/dL (ref 0–200)
HDL: 47.8 mg/dL (ref >39.00)
LDL Cholesterol: 71 mg/dL (ref 0–99)
NonHDL: 81.81
Total CHOL/HDL Ratio: 3
Triglycerides: 52 mg/dL (ref 0.0–149.0)
VLDL: 10.4 mg/dL (ref 0.0–40.0)

## 2021-12-27 LAB — TSH: TSH: 2.12 u[IU]/mL (ref 0.35–5.50)

## 2021-12-27 LAB — HEMOGLOBIN A1C: Hgb A1c MFr Bld: 5.6 % (ref 4.6–6.5)

## 2021-12-27 LAB — PSA, MEDICARE: PSA: 0.76 ng/ml (ref 0.10–4.00)

## 2021-12-27 MED ORDER — TAMSULOSIN HCL 0.4 MG PO CAPS: 0.4000 mg | ORAL_CAPSULE | Freq: Every day | ORAL | 0 refills | Status: DC

## 2021-12-27 NOTE — Progress Notes (Signed)
New Patient Office Visit  Subjective    Patient ID: Walter Washington, male    DOB: 18-Jan-1956  Age: 66 y.o. MRN: [id number]  CC:  Chief Complaint  Patient presents with   Establish Care    Would like to have glucose checked     HPI Walter Washington presents to establish care  for complete physical and follow up of chronic conditions.  Immunizations: -Tetanus:2013 -Influenza: Update today -Shingles: First vaccine, needs second vaccine -Pneumonia: Up-to-date  -HPV: Aged out  Diet: Fair diet. 3 meals home and eat out. Very little snacking. States that unsweet tea and water Exercise:  Walk daily 5 miles a day 5 days a week  Eye exam: Completes annually Uses reading glasses. Implanted lenses due to catarract surgery, approx 3 years ago. 2022 Dental exam: Completes semi-annually   Colonoscopy: Completed in 3-5 years ago that was clear. Recall in 10 years Lung Cancer Screening: N/A Dexa: N/A N/A  PSA: Due  Sleep: Statses beda round 12-8-830. Feels rested. Does not snore  States that he is experiencing some diff and frequncy at night. 1.5 every some nights. 2-3 always. Difficulty with stream at night only incomplete empyting.  States that he is concerned about his glucose. States that some of his sympotms. States that he is having numbness on bilateal toes. Altered sensation over the same   Outpatient Encounter Medications as of 12/27/2021  Medication Sig   tamsulosin (FLOMAX) 0.4 MG CAPS capsule Take 1 capsule (0.4 mg total) by mouth daily.   [DISCONTINUED] naproxen (NAPROSYN) 500 MG tablet Take 500 mg by mouth 2 (two) times daily with a meal.   No facility-administered encounter medications on file as of 12/27/2021.    No past medical history on file.  Past Surgical History:  Procedure Laterality Date   CHOLECYSTECTOMY     REPLACEMENT TOTAL KNEE Left    TONSILLECTOMY AND ADENOIDECTOMY      Family History  Problem Relation Age of Onset   Liver disease Mother     COPD Father     Social History   Socioeconomic History   Marital status: Married    Spouse name: [name]   Number of children: 2   Years of education: Not on file   Highest education level: Bachelor's degree (e.g., BA, AB, BS)  Occupational History   Not on file  Tobacco Use   Smoking status: Never    Passive exposure: Never   Smokeless tobacco: Never  Vaping Use   Vaping Use: Never used  Substance and Sexual Activity   Alcohol use: Not Currently    Comment: occ.   Drug use: Never   Sexual activity: Not on file  Other Topics Concern   Not on file  Social History Narrative   Walter Washington, [place]      IT guy mostly retired       Presenter, broadcasting: golf, piano   Social Determinants of Corporate investment banker Strain: Not on [employer] Insecurity: Not on file  Transportation Needs: Not on file  Physical Activity: Not on file  Stress: Not on file  Social Connections: Not on file  Intimate Partner Violence: Not on file    Review of Systems  Constitutional:  Negative for chills and fever.  Respiratory:  Negative for shortness of breath.   Cardiovascular:  Negative for chest pain.  Gastrointestinal:  Negative for abdominal pain, blood in stool, diarrhea, nausea and vomiting.       BM daily  Genitourinary:  Positive for frequency. Negative for dysuria and hematuria.  Neurological:  Positive for tingling. Negative for dizziness and headaches.  Psychiatric/Behavioral:  Negative for hallucinations and suicidal ideas.         Objective    BP 108/62   Pulse (!) 51   Temp 98.6 F (37 C) (Temporal)   Ht 6' 1.25" (1.861 m)   Wt 226 lb (102.5 kg)   SpO2 97%   BMI 29.61 kg/m   Physical Exam Vitals and nursing note reviewed.  Constitutional:      Appearance: Normal appearance.  HENT:     Right Ear: Tympanic membrane, ear canal and external ear normal.     Left Ear: Tympanic membrane, ear canal and external ear normal.     Mouth/Throat:     Mouth: Mucous membranes  are moist.     Pharynx: Oropharynx is clear.  Eyes:     Extraocular Movements: Extraocular movements intact.     Pupils: Pupils are equal, round, and reactive to light.     Comments: Reading glasses  Cardiovascular:     Rate and Rhythm: Normal rate and regular rhythm.     Pulses: Normal pulses.          Dorsalis pedis pulses are 2+ on the right side and 2+ on the left side.       Posterior tibial pulses are 2+ on the right side and 2+ on the left side.     Heart sounds: Normal heart sounds.  Pulmonary:     Effort: Pulmonary effort is normal.     Breath sounds: Normal breath sounds.  Abdominal:     General: Bowel sounds are normal. There is no distension.     Palpations: There is no mass.     Tenderness: There is no abdominal tenderness.     Hernia: No hernia is present.  Genitourinary:    Comments: Deferred per patient  Musculoskeletal:     Right lower leg: No edema.     Left lower leg: No edema.       Feet:  Feet:     Right foot:     Skin integrity: Skin integrity normal.     Left foot:     Skin integrity: Skin integrity normal.     Comments: Patient was able to feel monofilament.  States it did feel slightly different in areas circled red versus other parts of the foot.  PMS intact good cap refill good pulses bilaterally. Lymphadenopathy:     Cervical: No cervical adenopathy.  Skin:    General: Skin is warm.  Neurological:     General: No focal deficit present.     Mental Status: He is alert.     Deep Tendon Reflexes:     Reflex Scores:      Bicep reflexes are 2+ on the right side and 2+ on the left side.      Patellar reflexes are 2+ on the right side and 2+ on the left side.    Comments: Bilateral upper and lower extremity strength 5/5  Psychiatric:        Mood and Affect: Mood normal.        Behavior: Behavior normal.        Thought Content: Thought content normal.        Judgment: Judgment normal.         Assessment & Plan:   Problem List Items  Addressed This Visit       Other  Paresthesia    Altered sensation.  Pending B12 and electrolytes.  Circulation intact.      Relevant Orders   Vitamin B12   Hemoglobin A1c   TSH   Preventative health care - Primary    Discussed age-appropriate immunizations and screening exams.  Patient was given a packet of information at end of office visit with preventative healthcare maintenance for his age range inclusive of anticipatory guidance.      Relevant Orders   CBC   Comprehensive metabolic panel   Hemoglobin A1c   TSH   Lipid panel   Overweight    Continue working on lifestyle modifications.  Pending A1c and lipid today      Relevant Orders   Hemoglobin A1c   Lipid panel   Nocturia    Pending PSA.  Likely BPH with some symptoms.  Start patient on tamsulosin 0.4 mg nightly.      Relevant Medications   tamsulosin (FLOMAX) 0.4 MG CAPS capsule   Other Visit Diagnoses     Screening for prostate cancer       Relevant Orders   PSA, Medicare   Need for immunization against influenza       Relevant Orders   Flu Vaccine QUAD High Dose(Fluad) (Completed)       Return in about 1 year (around 12/28/2022) for CPe and labs.   Romilda Garret, NP

## 2021-12-27 NOTE — Assessment & Plan Note (Signed)
Pending PSA.  Likely BPH with some symptoms.  Start patient on tamsulosin 0.4 mg nightly.

## 2021-12-27 NOTE — Assessment & Plan Note (Signed)
Discussed age-appropriate immunizations and screening exams.  Patient was given a packet of information at end of office visit with preventative healthcare maintenance for his age range inclusive of anticipatory guidance.

## 2021-12-27 NOTE — Assessment & Plan Note (Signed)
Altered sensation.  Pending B12 and electrolytes.  Circulation intact.

## 2021-12-27 NOTE — Assessment & Plan Note (Signed)
Continue working on lifestyle modifications.  Pending A1c and lipid today

## 2021-12-27 NOTE — Patient Instructions (Signed)
Nice to see you today We updated your flu vaccine today I want to see you in 1 year, sooner if you need me I sent in 1 month of the tamsulosin to help with the urination. If it helps we will continue it  Get your second shingles vaccine at your local pharmacy

## 2021-12-31 ENCOUNTER — Encounter: Payer: Self-pay | Admitting: Nurse Practitioner

## 2022-01-18 ENCOUNTER — Other Ambulatory Visit: Payer: Self-pay | Admitting: Nurse Practitioner

## 2022-01-18 DIAGNOSIS — R351 Nocturia: Secondary | ICD-10-CM

## 2022-03-29 ENCOUNTER — Emergency Department
Admission: EM | Admit: 2022-03-29 | Discharge: 2022-03-29 | Disposition: A | Discharge status: Home / Self Care | Payer: Medicare PPO | Attending: Emergency Medicine | Admitting: Emergency Medicine

## 2022-03-29 ENCOUNTER — Emergency Department: Payer: Medicare PPO

## 2022-03-29 ENCOUNTER — Other Ambulatory Visit: Payer: Self-pay

## 2022-03-29 DIAGNOSIS — S99922A Unspecified injury of left foot, initial encounter: Secondary | ICD-10-CM | POA: Diagnosis present

## 2022-03-29 DIAGNOSIS — W208XXA Other cause of strike by thrown, projected or falling object, initial encounter: Secondary | ICD-10-CM | POA: Insufficient documentation

## 2022-03-29 DIAGNOSIS — Z23 Encounter for immunization: Secondary | ICD-10-CM | POA: Diagnosis not present

## 2022-03-29 DIAGNOSIS — Y92009 Unspecified place in unspecified non-institutional (private) residence as the place of occurrence of the external cause: Secondary | ICD-10-CM | POA: Insufficient documentation

## 2022-03-29 DIAGNOSIS — S91212A Laceration without foreign body of left great toe with damage to nail, initial encounter: Secondary | ICD-10-CM | POA: Insufficient documentation

## 2022-03-29 MED ORDER — TETANUS-DIPHTH-ACELL PERTUSSIS 5-2.5-18.5 LF-MCG/0.5 IM SUSY
0.5000 mL | PREFILLED_SYRINGE | Freq: Once | INTRAMUSCULAR | Status: AC
  Administered 2022-03-29: 0.5 mL via INTRAMUSCULAR
  Filled 2022-03-29: qty 0.5

## 2022-03-29 MED ORDER — LIDOCAINE HCL (PF) 1 % IJ SOLN
5.0000 mL | Freq: Once | INTRAMUSCULAR | Status: AC
  Administered 2022-03-29: 5 mL
  Filled 2022-03-29: qty 5

## 2022-03-29 NOTE — ED Provider Notes (Signed)
[facility] AT [facility] Provider Note   CSN: [id number] Arrival date & time: 03/29/22  1820     History  Chief Complaint  Patient presents with   Toe Injury    Walter Washington is a 67 y.o. male.  Presents to the emergency department for evaluation of trauma to the left great toe.  Patient states he was using a ladder and dropped a ladder onto his left great toe.  Suffered significant bleeding, pain and avulsion to the proximal nail.  Pain controlled.  Tetanus status unknown.  HPI     Home Medications Prior to Admission medications   Medication Sig Start Date End Date Taking? Authorizing Provider  tamsulosin (FLOMAX) 0.4 MG CAPS capsule TAKE 1 CAPSULE BY MOUTH EVERY DAY 01/22/22   Eden Emms, NP      Allergies    Penicillins    Review of Systems   Review of Systems  Physical Exam Updated Vital Signs BP (!) 145/89   Pulse 66   Temp 98 F (36.7 C) (Oral)   Resp 16   Ht 6\' 1"  (1.854 m)   Wt 102.1 kg   SpO2 98%   BMI 29.69 kg/m  Physical Exam Constitutional:      Appearance: He is well-developed.  HENT:     Head: Normocephalic and atraumatic.  Eyes:     Conjunctiva/sclera: Conjunctivae normal.  Cardiovascular:     Rate and Rhythm: Normal rate.  Pulmonary:     Effort: Pulmonary effort is normal. No respiratory distress.  Musculoskeletal:        General: Normal range of motion.     Cervical back: Normal range of motion.     Comments: Left great toe with proximal nail removed from the nail fold.  Nail completely removed after digital block.  There is a complex stellate laceration along the nailbed, no exposed bone.  Laceration thoroughly cleansed irrigated with Betadine and saline and repaired with 5-0 Vicryl sutures.  Skin:    General: Skin is warm.     Findings: No rash.  Neurological:     Mental Status: He is alert and oriented to person, place, and time.  Psychiatric:        Behavior: Behavior normal.        Thought  Content: Thought content normal.     ED Results / Procedures / Treatments   Labs (all labs ordered are listed, but only abnormal results are displayed) Labs Reviewed - No data to display  EKG None  Radiology DG Foot Complete Left  Result Date: 03/29/2022 CLINICAL DATA:  Trauma, toe injury. Big toe pain. EXAM: LEFT FOOT - COMPLETE 3+ VIEW COMPARISON:  Radiograph 12/30/2019 FINDINGS: No acute fracture or dislocation. Degenerative change of the first metatarsal phalangeal joint. Mild hammertoe deformity of the third through fifth toes. Slight dorsal spurring at the talonavicular joint. Plantar calcaneal spur. No erosive change or periostitis. No focal soft tissue abnormalities. IMPRESSION: 1. No acute fracture or dislocation. Mild degenerative change of the first metatarsophalangeal joint. 2. Plantar calcaneal spur. Electronically Signed   By: Narda Rutherford M.D.   On: 03/29/2022 19:05    Procedures .Marland KitchenLaceration Repair  Date/Time: 03/29/2022 9:04 PM  Performed by: Evon Slack, PA-C Authorized by: Evon Slack, PA-C   Consent:    Consent obtained:  Verbal   Consent given by:  Patient Universal protocol:    Imaging studies available: yes   Anesthesia:    Anesthesia method:  Nerve block  Block location:  Left great toe   Block needle gauge:  25 G   Block anesthetic:  Lidocaine 1% w/o epi   Block injection procedure:  Anatomic landmarks identified   Block outcome:  Anesthesia achieved Laceration details:    Length (cm):  3 Exploration:    Imaging obtained: x-ray     Imaging outcome: foreign body not noted     Contaminated: no   Treatment:    Area cleansed with:  Povidone-iodine and saline   Amount of cleaning:  Standard   Irrigation solution:  Sterile saline   Irrigation method:  Pressure wash   Debridement:  Moderate Skin repair:    Repair method:  Sutures   Suture size:  5-0   Suture material:  Fast-absorbing gut   Suture technique:  Simple interrupted    Number of sutures:  7 Approximation:    Approximation:  Close Repair type:    Repair type:  Simple Post-procedure details:    Dressing:  Antibiotic ointment and bulky dressing     Medications Ordered in ED Medications  Tdap (BOOSTRIX) injection 0.5 mL (0.5 mLs Intramuscular Given 03/29/22 1948)  lidocaine (PF) (XYLOCAINE) 1 % injection 5 mL (5 mLs Infiltration Given 03/29/22 1949)    ED Course/ Medical Decision Making/ A&P                             Medical Decision Making Amount and/or Complexity of Data Reviewed Radiology: ordered.  Risk Prescription drug management.  67 year old male with a laceration to the left great toe nailbed.  Nail was partially removed during the trauma.  Nail completely removed after successful digital block.  Wound was thoroughly irrigated and cleansed with Betadine and saline and laceration of the nailbed was repaired.  Bulky dressing applied.  Tetanus updated.  X-rays obtained show no acute bony abnormality or foreign body to the left great toe discussed with podiatrist with patient will follow-up next week Final Clinical Impression(s) / ED Diagnoses Final diagnoses:  Laceration of left great toe without foreign body with damage to nail, initial encounter    Rx / DC Orders ED Discharge Orders     None         Ronnette Juniper 03/29/22 2105    Jene Every, MD 04/01/22 [phone]

## 2022-03-29 NOTE — ED Triage Notes (Signed)
Pt to ed from home for toe injury. Pt smashed his toe on a ladder at home tonight. Pt has pain to left big toe. Pt is CAOX4 and in no acute distress and ambulatory in triage.

## 2022-03-29 NOTE — Discharge Instructions (Addendum)
Please continue to keep left great covered clean and dry for the next 2 days.  In 2 days you can remove bulky dressing, shower.  Apply antibiotic ointment along with Vaseline gauze covered with a dry dressing.  Change dressing daily.  Follow-up with podiatry next week.  Return to the ER for any severe pain worsening symptoms or any urgent changes in your health.  You may take Tylenol and ibuprofen as needed.

## 2022-04-01 ENCOUNTER — Telehealth: Payer: Self-pay

## 2022-04-01 NOTE — Telephone Encounter (Signed)
Transition Care Management Unsuccessful Follow-up Telephone Call  Date of discharge and from where:  [facility] 03-29-22 Dx: laceration of left great toe  Attempts:  1st Attempt  Reason for unsuccessful TCM follow-up call:  Unable to reach patient   Juanda Crumble LPN [place] Direct Dial [phone]

## 2022-04-02 NOTE — Telephone Encounter (Signed)
Transition Care Management Unsuccessful Follow-up Telephone Call  Date of discharge and from where:    [facility] 03-29-22 Dx: laceration of left great toe   Attempts:  2nd Attempt  Reason for unsuccessful TCM follow-up call:  Missing or invalid number  Pt has fu appt with Dr Posey Pronto 04-04-22 at [place] [phone]

## 2022-04-04 ENCOUNTER — Encounter: Payer: Self-pay | Admitting: Podiatry

## 2022-04-04 ENCOUNTER — Ambulatory Visit: Payer: Medicare PPO | Admitting: Podiatry

## 2022-04-04 VITALS — BP 136/80

## 2022-04-04 DIAGNOSIS — S90212A Contusion of left great toe with damage to nail, initial encounter: Secondary | ICD-10-CM | POA: Diagnosis not present

## 2022-04-04 DIAGNOSIS — B351 Tinea unguium: Secondary | ICD-10-CM | POA: Diagnosis not present

## 2022-04-04 DIAGNOSIS — Z79899 Other long term (current) drug therapy: Secondary | ICD-10-CM | POA: Diagnosis not present

## 2022-04-04 NOTE — Progress Notes (Signed)
Subjective:  Patient ID: Walter Washington, male    DOB: [birth year],  MRN: [id number]  Chief Complaint  Patient presents with   Foot Injury    Left great toe injury pt went to the hospital and was told to follow up     68 y.o. male presents with the above complaint.  Patient presents with left hallux nail contusion with removal of the nail in the emergency room he wanted to make sure that everything is healing well.  He denies any other acute complaints.  He could not get it to stop after the lateral great toe and the nail had to come off.  He also has secondary complaint of thickened and negative dystrophic mycotic toenails x 10 he would like to discuss treatment options for nail fungus.   Review of Systems: Negative except as noted in the HPI. Denies N/V/F/Ch.  No past medical history on file.  Current Outpatient Medications:    tamsulosin (FLOMAX) 0.4 MG CAPS capsule, TAKE 1 CAPSULE BY MOUTH EVERY DAY, Disp: 90 capsule, Rfl: 1  Social History   Tobacco Use  Smoking Status Never   Passive exposure: Never  Smokeless Tobacco Never    Allergies  Allergen Reactions   Penicillins Other (See Comments)    Syncope   Objective:   Vitals:   04/04/22 0840  BP: 136/80   There is no height or weight on file to calculate BMI. Constitutional Well developed. Well nourished.  Vascular Dorsalis pedis pulses palpable bilaterally. Posterior tibial pulses palpable bilaterally. Capillary refill normal to all digits.  No cyanosis or clubbing noted. Pedal hair growth normal.  Neurologic Normal speech. Oriented to person, place, and time. Epicritic sensation to light touch grossly present bilaterally.  Dermatologic Nails thickened elongated dystrophic mycotic toenails x 10 mild pain on palpation Left hallux nailbed is intact.  No signs of trauma noted.  Healing well.  No signs of infection noted  Orthopedic: Normal joint ROM without pain or crepitus bilaterally. No visible  deformities. No bony tenderness.   Radiographs: None Assessment:   1. Long-term use of high-risk medication   2. Nail fungus   3. Onychomycosis due to dermatophyte   4. Contusion of left great toe with damage to nail, initial encounter    Plan:  Patient was evaluated and treated and all questions answered.  Left hallux nail contusion with removal of nail -All questions and concerns were discussed with the patient in extensive detail.  Patient will do Betadine wet-to-dry dressing change daily for 2 weeks to help heal the avulsion site that was performed by the emergency room.  If it does not get better he will come back and see me.  Onychomycosis toenails x 10 -Educated the patient on the etiology of onychomycosis and various treatment options associated with improving the fungal load.  I explained to the patient that there is 3 treatment options available to treat the onychomycosis including topical, p.o., laser treatment.  Patient elected to undergo p.o. options with Lamisil/terbinafine therapy.  In order for me to start the medication therapy, I explained to the patient the importance of evaluating the liver and obtaining the liver function test.  Once the liver function test comes back normal I will start him on 62-month course of Lamisil therapy.  Patient understood all risk and would like to proceed with Lamisil therapy.  I have asked the patient to immediately stop the Lamisil therapy if she has any reactions to it and call the office or go to the  emergency room right away.  Patient states understanding   No follow-ups on file.

## 2022-04-09 LAB — HEPATIC FUNCTION PANEL
AG Ratio: 1.8 (calc) (ref 1.0–2.5)
ALT: 19 U/L (ref 9–46)
AST: 19 U/L (ref 10–35)
Albumin: 4.3 g/dL (ref 3.6–5.1)
Alkaline phosphatase (APISO): 73 U/L (ref 35–144)
Bilirubin, Direct: 0.1 mg/dL (ref 0.0–0.2)
Globulin: 2.4 g/dL (calc) (ref 1.9–3.7)
Indirect Bilirubin: 0.4 mg/dL (calc) (ref 0.2–1.2)
Total Bilirubin: 0.5 mg/dL (ref 0.2–1.2)
Total Protein: 6.7 g/dL (ref 6.1–8.1)

## 2022-04-09 MED ORDER — TERBINAFINE HCL 250 MG PO TABS: 250.0000 mg | ORAL_TABLET | Freq: Every day | ORAL | 0 refills | Status: DC

## 2022-04-09 NOTE — Addendum Note (Signed)
Addended by: Boneta Lucks on: 04/09/2022 08:27 AM   Modules accepted: Orders

## 2022-06-27 ENCOUNTER — Ambulatory Visit: Payer: Medicare PPO | Admitting: Nurse Practitioner

## 2022-06-27 ENCOUNTER — Encounter: Payer: Self-pay | Admitting: Nurse Practitioner

## 2022-06-27 VITALS — BP 134/64 | HR 56 | Temp 98.3°F | Resp 16 | Ht 73.0 in | Wt 237.4 lb

## 2022-06-27 DIAGNOSIS — L309 Dermatitis, unspecified: Secondary | ICD-10-CM | POA: Diagnosis not present

## 2022-06-27 MED ORDER — PREDNISONE 20 MG PO TABS: ORAL_TABLET | ORAL | 0 refills | Status: AC

## 2022-06-27 MED ORDER — METHYLPREDNISOLONE ACETATE 80 MG/ML IJ SUSP
80.0000 mg | Freq: Once | INTRAMUSCULAR | Status: AC
  Administered 2022-06-27: 80 mg via INTRAMUSCULAR

## 2022-06-27 NOTE — Progress Notes (Signed)
Acute Office Visit  Subjective:     Patient ID: Walter Washington, male    DOB: 1955/11/07, 67 y.o.   MRN: [id number]  Chief Complaint  Patient presents with   Rash    On both arms and chest X 2 weeks itches very little. Denies hurting. Has not changes soaps or detergent. Thinks it came from a plant.     Patient is in today for rash  Approx 2 weeks ago States that he was trimming shrubs and was weariing short sleeves and open shirt happened forlloing sday  States that it has improved some States that he has used benadryl gel that has helped Some itching   No change in detergents or products at home.  No new sheets or bed coverings either.  Patient is the only one in the household that had rash   Review of Systems  Constitutional:  Negative for chills and fever.  HENT:  Negative for sore throat.   Respiratory:  Negative for shortness of breath and stridor.   Cardiovascular:  Negative for chest pain.  Gastrointestinal:  Negative for abdominal pain, diarrhea, nausea and vomiting.  Skin:  Positive for itching and rash.        Objective:    BP 134/64   Pulse (!) 56   Temp 98.3 F (36.8 C)   Resp 16   Ht  (1.854 m)   Wt 237 lb 6 oz (107.7 kg)   SpO2 96%   BMI 31.32 kg/m    Physical Exam Vitals and nursing note reviewed.  Constitutional:      Appearance: Normal appearance.  HENT:     Mouth/Throat:     Mouth: Mucous membranes are moist.     Pharynx: Oropharynx is clear. No posterior oropharyngeal erythema.  Cardiovascular:     Rate and Rhythm: Normal rate and regular rhythm.     Heart sounds: Normal heart sounds.  Pulmonary:     Effort: Pulmonary effort is normal.     Breath sounds: Normal breath sounds.  Skin:    Findings: Erythema, lesion and rash present.          Comments: Macular lesions that are coalescing. Erythema and dry skin in patches. No discharge or warmth. No concern for infection   Neurological:     Mental Status: He is alert.      No results found for any visits on 06/27/22.      Assessment & Plan:   Problem List Items Addressed This Visit       Musculoskeletal and Integument   Dermatitis - Primary    Depo-Medrol 80 mg given IM x 1 dose.  Patient will start prednisone taper tomorrow on 06/28/2022.  And avoid NSAIDs while on medication take prednisone with food.  Follow-up if no improvement.  No signs or symptoms indicative of cellulitis or skin infection.  Patient instructed to keep area moisturized and when drying pat dry versus spreading.      Relevant Medications   predniSONE (DELTASONE) 20 MG tablet    Meds ordered this encounter  Medications   methylPREDNISolone acetate (DEPO-MEDROL) injection 80 mg   predniSONE (DELTASONE) 20 MG tablet    Sig: Take 1 tablet (20 mg total) by mouth 2 (two) times daily with a meal for 3 days, THEN 1 tablet (20 mg total) daily with breakfast for 3 days. Avoid Nsaids like Ibuprofen, motrin, aleve, naproxen, BC/Goody powders.    Dispense:  9 tablet    Refill:  0  Order Specific Question:   Supervising Provider    Answer:   Roxy Manns A [[id number]]    Return in about 6 months (around 12/29/2022) for CPE and Labs.  Audria Nine, NP

## 2022-06-27 NOTE — Assessment & Plan Note (Signed)
Depo-Medrol 80 mg given IM x 1 dose.  Patient will start prednisone taper tomorrow on 06/28/2022.  And avoid NSAIDs while on medication take prednisone with food.  Follow-up if no improvement.  No signs or symptoms indicative of cellulitis or skin infection.  Patient instructed to keep area moisturized and when drying pat dry versus spreading.

## 2022-06-27 NOTE — Patient Instructions (Signed)
Nice to see you today Start the pill steroids tomorrow 06/28/2022 Keep the skin moisturized. Pat dry when you dry off from bathing

## 2022-07-25 ENCOUNTER — Other Ambulatory Visit: Payer: Self-pay | Admitting: Nurse Practitioner

## 2022-07-25 DIAGNOSIS — R351 Nocturia: Secondary | ICD-10-CM

## 2022-12-17 ENCOUNTER — Ambulatory Visit: Payer: Medicare PPO | Admitting: Nurse Practitioner

## 2022-12-17 ENCOUNTER — Encounter: Payer: Self-pay | Admitting: Nurse Practitioner

## 2022-12-17 VITALS — BP 118/60 | HR 53 | Temp 98.2°F | Ht 73.0 in | Wt 231.6 lb

## 2022-12-17 DIAGNOSIS — Z87898 Personal history of other specified conditions: Secondary | ICD-10-CM

## 2022-12-17 DIAGNOSIS — Z833 Family history of diabetes mellitus: Secondary | ICD-10-CM

## 2022-12-17 DIAGNOSIS — E162 Hypoglycemia, unspecified: Secondary | ICD-10-CM

## 2022-12-17 DIAGNOSIS — R531 Weakness: Secondary | ICD-10-CM | POA: Diagnosis not present

## 2022-12-17 DIAGNOSIS — R351 Nocturia: Secondary | ICD-10-CM

## 2022-12-17 LAB — CBC
HCT: 46.8 % (ref 39.0–52.0)
Hemoglobin: 15.4 g/dL (ref 13.0–17.0)
MCHC: 32.9 g/dL (ref 30.0–36.0)
MCV: 96.8 fL (ref 78.0–100.0)
Platelets: 185 10*3/uL (ref 150.0–400.0)
RBC: 4.84 Mil/uL (ref 4.22–5.81)
RDW: 13.3 % (ref 11.5–15.5)
WBC: 3.6 10*3/uL — ABNORMAL LOW (ref 4.0–10.5)

## 2022-12-17 LAB — COMPREHENSIVE METABOLIC PANEL
ALT: 21 U/L (ref 0–53)
AST: 19 U/L (ref 0–37)
Albumin: 4.2 g/dL (ref 3.5–5.2)
Alkaline Phosphatase: 72 U/L (ref 39–117)
BUN: 17 mg/dL (ref 6–23)
CO2: 27 meq/L (ref 19–32)
Calcium: 9.5 mg/dL (ref 8.4–10.5)
Chloride: 104 meq/L (ref 96–112)
Creatinine, Ser: 0.91 mg/dL (ref 0.40–1.50)
GFR: 85.99 mL/min (ref >60.00)
Glucose, Bld: 119 mg/dL — ABNORMAL HIGH (ref 70–99)
Potassium: 4.4 meq/L (ref 3.5–5.1)
Sodium: 139 meq/L (ref 135–145)
Total Bilirubin: 0.6 mg/dL (ref 0.2–1.2)
Total Protein: 6.4 g/dL (ref 6.0–8.3)

## 2022-12-17 LAB — TSH: TSH: 2.48 u[IU]/mL (ref 0.35–5.50)

## 2022-12-17 LAB — HEMOGLOBIN A1C: Hgb A1c MFr Bld: 5.5 % (ref 4.6–6.5)

## 2022-12-17 MED ORDER — TAMSULOSIN HCL 0.4 MG PO CAPS: 0.8000 mg | ORAL_CAPSULE | Freq: Every day | ORAL | 1 refills | Status: DC

## 2022-12-17 NOTE — Assessment & Plan Note (Signed)
Walter Washington patient is having episodes of hypoglycemia.  He is eating balanced meals throughout the day.  Patient will do a protein heavy snack at bedtime will check basic labs and insulin levels to make sure patient is not producing more insulin if needed.  No history of cancers in the family.  But family history of diabetes in the family

## 2022-12-17 NOTE — Assessment & Plan Note (Signed)
History of the same.  Patient was put on tamsulosin 0.4 mg nightly that was beneficial but has since quit working.  Will increase to 0.8 mg at night.  Follow-up if no improvement

## 2022-12-17 NOTE — Assessment & Plan Note (Signed)
Patient states she had appearance of getting had a sweaty and feeling weak and could not fix it.  Does have a family history of diabetes patient has a personal history of prediabetes will check basic blood work inclusive of CBC, CMP, TSH.  Pending results

## 2022-12-17 NOTE — Progress Notes (Signed)
Acute Office Visit  Subjective:     Patient ID: Walter Washington, male    DOB: 1955-06-01, 67 y.o.   MRN: [id number]  Chief Complaint  Patient presents with   Night Sweats    Pt complains of constant hot,sweaty, and weakness that last for 20 minutes each day. Pt states its been a couple of months.  Pt notices that when he eats something, symptoms go away.     HPI Patient is in today for sweats with a history of nocturia   States that it has been the past 2-3 months. States that when he called he was getting just night sweats. States that now it is 4-5 days a week around noon during the day. State that he wil lget up around 9am and will eat lunch around 1 and dinner is at 7. No snacking. States that he will drink water, coffee in the am. Unsweet tea.  Nocturia: state that he will get up at 3 times a night. Now. States that he is getting up once when it was wrking well. States that he has a different urge to go at night that hurts in the lower back and willhave to double void to get the urine out.   Review of Systems  Constitutional:  Negative for chills and fever.  HENT:  Negative for ear discharge, ear pain, sinus pain and sore throat.   Respiratory:  Negative for shortness of breath.   Cardiovascular:  Negative for chest pain.  Gastrointestinal:  Negative for abdominal pain, diarrhea, nausea and vomiting.  Genitourinary:        Nocturia x3  Neurological:  Negative for dizziness and headaches.        Objective:    BP 118/60   Pulse (!) 53   Temp 98.2 F (36.8 C) (Oral)   Ht 6\' 1"  (1.854 m)   Wt 231 lb 9.6 oz (105.1 kg)   SpO2 93%   BMI 30.56 kg/m  BP Readings from Last 3 Encounters:  12/17/22 118/60  06/27/22 134/64  04/04/22 136/80   Wt Readings from Last 3 Encounters:  12/17/22 231 lb 9.6 oz (105.1 kg)  06/27/22 237 lb 6 oz (107.7 kg)  03/29/22 225 lb (102.1 kg)   SpO2 Readings from Last 3 Encounters:  12/17/22 93%  06/27/22 96%  03/29/22 99%       Physical Exam Vitals and nursing note reviewed.  Constitutional:      Appearance: Normal appearance.  Cardiovascular:     Rate and Rhythm: Normal rate and regular rhythm.     Heart sounds: Normal heart sounds.  Pulmonary:     Effort: Pulmonary effort is normal.     Breath sounds: Normal breath sounds.  Abdominal:     General: Bowel sounds are normal. There is no distension.     Palpations: There is no mass.     Tenderness: There is no abdominal tenderness.     Hernia: No hernia is present.  Lymphadenopathy:     Cervical: No cervical adenopathy.  Neurological:     Mental Status: He is alert.     No results found for any visits on 12/17/22.      Assessment & Plan:   Problem List Items Addressed This Visit       Endocrine   Hypoglycemia - Primary    [place] patient is having episodes of hypoglycemia.  He is eating balanced meals throughout the day.  Patient will do a protein heavy snack at bedtime will check  basic labs and insulin levels to make sure patient is not producing more insulin if needed.  No history of cancers in the family.  But family history of diabetes in the family      Relevant Orders   CBC   Comprehensive metabolic panel   TSH   Insulin, Free and Total     Other   Nocturia    History of the same.  Patient was put on tamsulosin 0.4 mg nightly that was beneficial but has since quit working.  Will increase to 0.8 mg at night.  Follow-up if no improvement      Relevant Medications   tamsulosin (FLOMAX) 0.4 MG CAPS capsule   Weakness    Patient states she had appearance of getting had a sweaty and feeling weak and could not fix it.  Does have a family history of diabetes patient has a personal history of prediabetes will check basic blood work inclusive of CBC, CMP, TSH.  Pending results      Relevant Orders   CBC   Comprehensive metabolic panel   TSH   Other Visit Diagnoses     Family history of diabetes mellitus       Relevant Orders    Hemoglobin A1c   History of prediabetes       Relevant Orders   Hemoglobin A1c       Meds ordered this encounter  Medications   tamsulosin (FLOMAX) 0.4 MG CAPS capsule    Sig: Take 2 capsules (0.8 mg total) by mouth daily.    Dispense:  180 capsule    Refill:  1    Order Specific Question:   Supervising Provider    Answer:   Roxy Manns A [[id number]]    Return if symptoms worsen or fail to improve, for As scheduled .  Audria Nine, NP

## 2022-12-17 NOTE — Patient Instructions (Signed)
Nice to see you today Try adding in a protein heavy snack prior to bed I will be in touch with the labs once I have them

## 2022-12-28 LAB — INSULIN, FREE AND TOTAL
Free Insulin: 19 uU/mL — ABNORMAL HIGH
Total Insulin: 19 uU/mL

## 2023-01-07 ENCOUNTER — Encounter: Payer: Self-pay | Admitting: Nurse Practitioner

## 2023-01-07 DIAGNOSIS — H81399 Other peripheral vertigo, unspecified ear: Secondary | ICD-10-CM

## 2023-01-07 DIAGNOSIS — R42 Dizziness and giddiness: Secondary | ICD-10-CM

## 2023-01-07 DIAGNOSIS — E162 Hypoglycemia, unspecified: Secondary | ICD-10-CM

## 2023-01-10 MED ORDER — BLOOD GLUCOSE MONITORING SUPPL DEVI: 1.0000 | Freq: Every day | 0 refills | Status: DC

## 2023-01-10 MED ORDER — LANCETS MISC. MISC: 1.0000 | Freq: Every day | 0 refills | Status: DC

## 2023-01-10 MED ORDER — LANCET DEVICE MISC: 1.0000 | Freq: Three times a day (TID) | 0 refills | Status: AC

## 2023-01-10 MED ORDER — BLOOD GLUCOSE TEST VI STRP: 1.0000 | ORAL_STRIP | Freq: Every day | 0 refills | Status: DC

## 2023-01-13 NOTE — Addendum Note (Signed)
Addended by: Eden Emms on: 01/13/2023 01:38 PM   Modules accepted: Orders

## 2023-01-15 ENCOUNTER — Encounter: Payer: Self-pay | Admitting: *Deleted

## 2023-01-24 ENCOUNTER — Ambulatory Visit
Admission: RE | Admit: 2023-01-24 | Discharge: 2023-01-24 | Disposition: A | Discharge status: Home / Self Care | Payer: Medicare PPO | Source: Ambulatory Visit | Attending: Nurse Practitioner | Admitting: Nurse Practitioner

## 2023-01-24 DIAGNOSIS — H81399 Other peripheral vertigo, unspecified ear: Secondary | ICD-10-CM | POA: Diagnosis present

## 2023-01-24 DIAGNOSIS — R42 Dizziness and giddiness: Secondary | ICD-10-CM | POA: Insufficient documentation

## 2023-02-07 ENCOUNTER — Ambulatory Visit (INDEPENDENT_AMBULATORY_CARE_PROVIDER_SITE_OTHER): Payer: Medicare PPO | Admitting: Nurse Practitioner

## 2023-02-07 ENCOUNTER — Encounter: Payer: Self-pay | Admitting: Nurse Practitioner

## 2023-02-07 VITALS — BP 108/72 | HR 62 | Temp 97.9°F | Ht 73.0 in | Wt 234.0 lb

## 2023-02-07 DIAGNOSIS — Z0000 Encounter for general adult medical examination without abnormal findings: Secondary | ICD-10-CM

## 2023-02-07 DIAGNOSIS — Z13220 Encounter for screening for lipoid disorders: Secondary | ICD-10-CM | POA: Diagnosis not present

## 2023-02-07 DIAGNOSIS — Z1211 Encounter for screening for malignant neoplasm of colon: Secondary | ICD-10-CM

## 2023-02-07 DIAGNOSIS — E663 Overweight: Secondary | ICD-10-CM | POA: Diagnosis not present

## 2023-02-07 DIAGNOSIS — Z125 Encounter for screening for malignant neoplasm of prostate: Secondary | ICD-10-CM

## 2023-02-07 DIAGNOSIS — R351 Nocturia: Secondary | ICD-10-CM

## 2023-02-07 MED ORDER — FINASTERIDE 5 MG PO TABS: 5.0000 mg | ORAL_TABLET | Freq: Every day | ORAL | 2 refills | Status: DC

## 2023-02-07 NOTE — Assessment & Plan Note (Signed)
Discussed age-appropriate immunizations and screening exams.  Did review patient's personal, surgical, social, family histories.  Patient is up-to-date with all age-appropriate vaccinations that he would like.  Encouraged to get second shingles vaccine at local pharmacy.  Patient is unsure of when he is due for [facility] screening ambulatory referral to GI.  PSA order placed today for prostate cancer screening.  Patient was given information at discharge about preventative healthcare maintenance with anticipatory guidance.

## 2023-02-07 NOTE — Patient Instructions (Signed)
 Nice to see you today Make a fasting lab appointment within the next 2 weeks Follow up with me in 1 year, sooner if you need me

## 2023-02-07 NOTE — Progress Notes (Signed)
Established Patient Office Visit  Subjective   Patient ID: Walter Washington, male    DOB: 04/30/1955  Age: 67 y.o. MRN: [id number]  Chief Complaint  Patient presents with   Annual Exam    Wanted to discuss the results of his recent CT Scan, but it has not been read.     HPI  for complete physical and follow up of chronic conditions.   Nocturia: flomax 0.8mg  at bedtime. States that he will get up approx 2 times a night was 3+ previously. State that he had trouble getting flow to start     Immunizations: -Tetanus: Completed in 2024 -Influenza: 12/19/2022 -Shingles: Completed Shingrix first vaccine get second at local pharmacy -Pneumonia: Completed 2021  Diet: Fair diet. 3 meals a day with rare snacking.  He will dirnk coffee unweet tea and water Exercise: No regular exercise. State that he walks 3-5 days a week with 10K steps a day. States with walking he will do 2-3 miles a day   Eye exam: Completes annually . Hx of cataracts  Dental exam: Completes semi-annually    Colonoscopy: Completed in unsure when it was done but 1 year recall  Lung Cancer Screening: N/A PSA: Due  Sleep:goes  to bed 12-1 and gets up 8-9. Feels rested. Does not snore   Abdominal pain: states that it happens on ly when he has to urinate. States that he is going through him from when his bladder is through his back     Review of Systems  Constitutional:  Negative for chills and fever.  Respiratory:  Negative for shortness of breath.   Cardiovascular:  Negative for chest pain and leg swelling.  Gastrointestinal:  Negative for abdominal pain, blood in stool, constipation, diarrhea, nausea and vomiting.       Bm dailly   Genitourinary:  Negative for dysuria and hematuria.  Neurological:  Negative for tingling and headaches.  Psychiatric/Behavioral:  Negative for hallucinations and suicidal ideas.       Objective:     BP 108/72 (BP Location: Left Arm, Patient Position: Sitting, Cuff Size: Large)    Pulse 62   Temp 97.9 F (36.6 C) (Oral)   Ht 6\' 1"  (1.854 m)   Wt 234 lb (106.1 kg)   SpO2 94%   BMI 30.87 kg/m    Physical Exam Vitals and nursing note reviewed.  Constitutional:      Appearance: Normal appearance.  HENT:     Right Ear: Tympanic membrane, ear canal and external ear normal.     Left Ear: Tympanic membrane, ear canal and external ear normal.     Mouth/Throat:     Mouth: Mucous membranes are moist.     Pharynx: Oropharynx is clear.  Eyes:     Extraocular Movements: Extraocular movements intact.     Pupils: Pupils are equal, round, and reactive to light.  Cardiovascular:     Rate and Rhythm: Normal rate and regular rhythm.     Pulses: Normal pulses.     Heart sounds: Normal heart sounds.  Pulmonary:     Effort: Pulmonary effort is normal.     Breath sounds: Normal breath sounds.  Abdominal:     General: Bowel sounds are normal. There is no distension.     Palpations: There is no mass.     Tenderness: There is no abdominal tenderness.     Hernia: No hernia is present.  Musculoskeletal:     Right lower leg: No edema.     Left  lower leg: No edema.  Lymphadenopathy:     Cervical: No cervical adenopathy.  Skin:    General: Skin is warm.  Neurological:     General: No focal deficit present.     Mental Status: He is alert.     Deep Tendon Reflexes:     Reflex Scores:      Bicep reflexes are 2+ on the right side and 2+ on the left side.      Patellar reflexes are 2+ on the right side and 2+ on the left side.    Comments: Bilateral upper and lower extremity strength 5/5  Psychiatric:        Mood and Affect: Mood normal.        Behavior: Behavior normal.        Thought Content: Thought content normal.        Judgment: Judgment normal.      No results found for any visits on 02/07/23.    The 10-year ASCVD risk score (Arnett DK, et al., 2019) is: 10.4%    Assessment & Plan:   Problem List Items Addressed This Visit       Other    Preventative health care - Primary    Discussed age-appropriate immunizations and screening exams.  Did review patient's personal, surgical, social, family histories.  Patient is up-to-date with all age-appropriate vaccinations that he would like.  Encouraged to get second shingles vaccine at local pharmacy.  Patient is unsure of when he is due for [facility] screening ambulatory referral to GI.  PSA order placed today for prostate cancer screening.  Patient was given information at discharge about preventative healthcare maintenance with anticipatory guidance.      Overweight    Pending lipid panel.  Continue working on healthy lifestyle modifications      Relevant Orders   Lipid panel   Nocturia    History of the same.  Patient currently on tamsulosin 0.8 mg nightly.  Patient still having what sounds to be like bladder spasms and incomplete emptying we will add on finasteride 5 mg.  Did discuss with patient this can take up to 6 months to have full benefit      Relevant Medications   finasteride (PROSCAR) 5 MG tablet   Other Visit Diagnoses     Screening for colon cancer       Relevant Orders   Ambulatory referral to Gastroenterology   Screening for prostate cancer       Relevant Orders   PSA, Medicare   Screening for lipid disorders           Return in about 1 year (around 02/07/2024) for CPE and Labs.    Audria Nine, NP

## 2023-02-07 NOTE — Assessment & Plan Note (Signed)
Pending lipid panel.  Continue working on healthy lifestyle modifications

## 2023-02-07 NOTE — Assessment & Plan Note (Signed)
History of the same.  Patient currently on tamsulosin 0.8 mg nightly.  Patient still having what sounds to be like bladder spasms and incomplete emptying we will add on finasteride 5 mg.  Did discuss with patient this can take up to 6 months to have full benefit

## 2023-02-10 ENCOUNTER — Other Ambulatory Visit (INDEPENDENT_AMBULATORY_CARE_PROVIDER_SITE_OTHER): Payer: Medicare PPO

## 2023-02-10 DIAGNOSIS — Z125 Encounter for screening for malignant neoplasm of prostate: Secondary | ICD-10-CM | POA: Diagnosis not present

## 2023-02-10 DIAGNOSIS — E663 Overweight: Secondary | ICD-10-CM

## 2023-02-10 LAB — LIPID PANEL
Cholesterol: 127 mg/dL (ref 0–200)
HDL: 39 mg/dL — ABNORMAL LOW (ref >39.00)
LDL Cholesterol: 76 mg/dL (ref 0–99)
NonHDL: 88.4
Total CHOL/HDL Ratio: 3
Triglycerides: 63 mg/dL (ref 0.0–149.0)
VLDL: 12.6 mg/dL (ref 0.0–40.0)

## 2023-02-10 LAB — PSA, MEDICARE: PSA: 1.11 ng/mL (ref 0.10–4.00)

## 2023-02-11 ENCOUNTER — Telehealth: Payer: Self-pay

## 2023-02-11 DIAGNOSIS — Z1211 Encounter for screening for malignant neoplasm of colon: Secondary | ICD-10-CM

## 2023-02-11 NOTE — Telephone Encounter (Signed)
Pt not ready to schedule his colonoscopy.  He plans on checking his records-believes his colonoscopy is due after he turns 68 and will call back closer to when it is due.  Reminder letter has been mailed to him.  Referral closed until then.  Thanks,  [place], [place]

## 2023-03-13 ENCOUNTER — Telehealth: Payer: Self-pay

## 2023-03-13 DIAGNOSIS — R351 Nocturia: Secondary | ICD-10-CM

## 2023-03-13 NOTE — Telephone Encounter (Signed)
 Pt requests medications be sent to [facility] pharmacy   LAST APPOINTMENT DATE: 02/07/2023   NEXT APPOINTMENT DATE: 02/11/2024  Finasteride  LAST REFILL: 02/07/2023 QTY: #90 2RF  Flomax 0.4  LAST REFILL: 02/07/2023 QTY: #180 1RF

## 2023-03-14 MED ORDER — TAMSULOSIN HCL 0.4 MG PO CAPS: 0.8000 mg | ORAL_CAPSULE | Freq: Every day | ORAL | 1 refills | Status: DC

## 2023-03-14 MED ORDER — FINASTERIDE 5 MG PO TABS: 5.0000 mg | ORAL_TABLET | Freq: Every day | ORAL | 2 refills | Status: DC

## 2023-03-14 NOTE — Addendum Note (Signed)
 Addended by: Eden Emms on: 03/14/2023 02:50 PM   Modules accepted: Orders

## 2023-05-26 DIAGNOSIS — D225 Melanocytic nevi of trunk: Secondary | ICD-10-CM | POA: Diagnosis not present

## 2023-05-26 DIAGNOSIS — D2272 Melanocytic nevi of left lower limb, including hip: Secondary | ICD-10-CM | POA: Diagnosis not present

## 2023-05-26 DIAGNOSIS — L918 Other hypertrophic disorders of the skin: Secondary | ICD-10-CM | POA: Diagnosis not present

## 2023-05-26 DIAGNOSIS — D2262 Melanocytic nevi of left upper limb, including shoulder: Secondary | ICD-10-CM | POA: Diagnosis not present

## 2023-05-26 DIAGNOSIS — L814 Other melanin hyperpigmentation: Secondary | ICD-10-CM | POA: Diagnosis not present

## 2023-05-26 DIAGNOSIS — L564 Polymorphous light eruption: Secondary | ICD-10-CM | POA: Diagnosis not present

## 2023-05-26 DIAGNOSIS — D485 Neoplasm of uncertain behavior of skin: Secondary | ICD-10-CM | POA: Diagnosis not present

## 2023-05-26 DIAGNOSIS — L821 Other seborrheic keratosis: Secondary | ICD-10-CM | POA: Diagnosis not present

## 2023-05-26 DIAGNOSIS — L570 Actinic keratosis: Secondary | ICD-10-CM | POA: Diagnosis not present

## 2023-07-15 DIAGNOSIS — M7062 Trochanteric bursitis, left hip: Secondary | ICD-10-CM | POA: Diagnosis not present

## 2023-07-15 DIAGNOSIS — E663 Overweight: Secondary | ICD-10-CM | POA: Diagnosis not present

## 2023-07-15 DIAGNOSIS — Z6829 Body mass index (BMI) 29.0-29.9, adult: Secondary | ICD-10-CM | POA: Diagnosis not present

## 2023-07-15 DIAGNOSIS — M25552 Pain in left hip: Secondary | ICD-10-CM | POA: Diagnosis not present

## 2023-08-10 ENCOUNTER — Other Ambulatory Visit: Payer: Self-pay | Admitting: Nurse Practitioner

## 2023-08-10 DIAGNOSIS — R351 Nocturia: Secondary | ICD-10-CM

## 2023-10-23 DIAGNOSIS — H52223 Regular astigmatism, bilateral: Secondary | ICD-10-CM | POA: Diagnosis not present

## 2023-10-23 DIAGNOSIS — H5203 Hypermetropia, bilateral: Secondary | ICD-10-CM | POA: Diagnosis not present

## 2023-10-23 DIAGNOSIS — H26493 Other secondary cataract, bilateral: Secondary | ICD-10-CM | POA: Diagnosis not present

## 2023-10-23 DIAGNOSIS — H524 Presbyopia: Secondary | ICD-10-CM | POA: Diagnosis not present

## 2023-10-23 DIAGNOSIS — Z961 Presence of intraocular lens: Secondary | ICD-10-CM | POA: Diagnosis not present

## 2023-12-01 ENCOUNTER — Other Ambulatory Visit: Payer: Self-pay | Admitting: Nurse Practitioner

## 2023-12-01 DIAGNOSIS — R351 Nocturia: Secondary | ICD-10-CM

## 2023-12-22 ENCOUNTER — Ambulatory Visit

## 2023-12-31 ENCOUNTER — Other Ambulatory Visit: Payer: Self-pay | Admitting: *Deleted

## 2023-12-31 ENCOUNTER — Telehealth: Payer: Self-pay | Admitting: *Deleted

## 2023-12-31 DIAGNOSIS — Z1211 Encounter for screening for malignant neoplasm of colon: Secondary | ICD-10-CM

## 2023-12-31 MED ORDER — NA SULFATE-K SULFATE-MG SULF 17.5-3.13-1.6 GM/177ML PO SOLN: 1.0000 | Freq: Once | ORAL | 0 refills | Status: AC

## 2023-12-31 NOTE — Telephone Encounter (Signed)
 Gastroenterology Pre-Procedure Review  Request Date: 03/09/2024 Requesting Physician: Dr. Jinny  PATIENT REVIEW QUESTIONS: The patient responded to the following health history questions as indicated:    1. Are you having any GI issues? no 2. Do you have a personal history of Polyps? no 3. Do you have a family history of Colon Cancer or Polyps? no 4. Diabetes Mellitus? no 5. Joint replacements in the past 12 months?no 6. Major health problems in the past 3 months?no 7. Any artificial heart valves, MVP, or defibrillator?no    MEDICATIONS & ALLERGIES:    Patient reports the following regarding taking any anticoagulation/antiplatelet therapy:   Plavix, Coumadin, Eliquis, Xarelto, Lovenox, Pradaxa, Brilinta, or Effient? no Aspirin? no  Patient confirms/reports the following medications:  Current Outpatient Medications  Medication Sig Dispense Refill   Blood Glucose Monitoring Suppl DEVI 1 each by Does not apply route daily. May substitute to any manufacturer covered by patient's insurance. 1 each 0   finasteride  (PROSCAR ) 5 MG tablet TAKE 1 TABLET EVERY DAY 90 tablet 0   Glucose Blood (BLOOD GLUCOSE TEST STRIPS) STRP 1 each by In Vitro route daily. May substitute to any manufacturer covered by patient's insurance. 50 strip 0   Lancets Misc. MISC 1 each by Does not apply route daily. May substitute to any manufacturer covered by patient's insurance. 50 each 0   tamsulosin  (FLOMAX ) 0.4 MG CAPS capsule TAKE 2 CAPSULES EVERY DAY 180 capsule 3   No current facility-administered medications for this visit.    Patient confirms/reports the following allergies:  Allergies  Allergen Reactions   Penicillins Other (See Comments)    Syncope    No orders of the defined types were placed in this encounter.   AUTHORIZATION INFORMATION Primary Insurance: 1D#: Group #:  Secondary Insurance: 1D#: Group #:  SCHEDULE INFORMATION: Date: 03/09/2024 Time: Location: [facility]

## 2024-01-16 ENCOUNTER — Ambulatory Visit: Admitting: Nurse Practitioner

## 2024-01-16 VITALS — BP 120/62 | HR 47 | Temp 97.9°F | Ht 73.0 in | Wt 223.8 lb

## 2024-01-16 DIAGNOSIS — M79672 Pain in left foot: Secondary | ICD-10-CM | POA: Diagnosis not present

## 2024-01-16 NOTE — Patient Instructions (Signed)
 Nice to see you today I think you have the beginnings of a corn A neuroma is also a possibility but I think less likely Call and schedule with Dr Tobie the podiatrist  Keep your appointment with me as scheduled

## 2024-01-16 NOTE — Progress Notes (Signed)
 Established Patient Office Visit  Subjective   Patient ID: Walter Washington, male    DOB: 1955/12/03  Age: 68 y.o. MRN: [id number]  Chief Complaint  Patient presents with   Foot Pain    Pt complains of left foot pain that is localized in one spot. States when pressure is added to spot the pain starts. Hurts to walk on foot. Ongoing for 3-4 months.     Discussed the use of AI scribe software for clinical note transcription with the patient, who gave verbal consent to proceed.  History of Present Illness Walter Washington is a 68 year old male who presents with left foot pain.  He has been experiencing left foot pain for the past three to four months. The pain is described as a burning sensation that occurs after walking and is localized to a specific spot on the bottom of the foot. Pressing on this spot elicits a sharp pain. The pain does not occur when he is resting or sitting.  He denies any specific injury to the foot and has not experienced any numbness or tingling. He has not attempted any treatments such as changing shoes, applying ice or heat, or using medications like ibuprofen  or naproxen.  His current medications include finasteride  and tamsulosin .     Review of Systems  Constitutional:  Negative for chills and fever.  Respiratory:  Negative for shortness of breath.   Cardiovascular:  Negative for chest pain.  Musculoskeletal:  Positive for joint pain.  Neurological:  Negative for tingling.       Burning +      Objective:     BP 120/62   Pulse (!) 47   Temp 97.9 F (36.6 C) (Oral)   Ht 6' 1 (1.854 m)   Wt 223 lb 12.8 oz (101.5 kg)   SpO2 97%   BMI 29.53 kg/m    Physical Exam Vitals and nursing note reviewed.  Constitutional:      Appearance: Normal appearance.  Cardiovascular:     Rate and Rhythm: Normal rate and regular rhythm.     Pulses:          Dorsalis pedis pulses are 2+ on the left side.       Posterior tibial pulses are 2+ on the  left side.     Heart sounds: Normal heart sounds.  Pulmonary:     Effort: Pulmonary effort is normal.     Breath sounds: Normal breath sounds.  Musculoskeletal:        General: Tenderness present.       Feet:  Feet:     Left foot:     Skin integrity: Dry skin present.     Toenail Condition: Left toenails are abnormally thick. Fungal disease present. Neurological:     Mental Status: He is alert.      No results found for any visits on 01/16/24.    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 Skin thickening observed, neuroma considered due to  burning sensation and pressure pain. - Referred to podiatrist for further evaluation and management. Patient already has  - Advised against over-the-counter corn treatments due to potential gait disruption.   Return if symptoms worsen or fail to improve, for As scheduled.    Adina Crandall, NP

## 2024-02-06 ENCOUNTER — Ambulatory Visit: Admitting: Podiatry

## 2024-02-06 DIAGNOSIS — Q828 Other specified congenital malformations of skin: Secondary | ICD-10-CM | POA: Diagnosis not present

## 2024-02-06 NOTE — Progress Notes (Signed)
°  Subjective:  Patient ID: Walter Washington, male    DOB: 06/04/1955,  MRN: [id number]  Chief Complaint  Patient presents with   Callouses    68 y.o. male presents with the above complaint.  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 Denies N/V/F/Ch.  Past Medical History:  Diagnosis Date   Allergy 1963   Allergy to Penicillian   Arthritis Feet and Knee   2010   Cataract 2018   Implants have corrected issue   Current Medications[1]  Tobacco Use History[2]  Allergies[3] Objective:  There were no vitals filed for this visit. There is no height or weight on file to calculate BMI. Constitutional Well developed. Well nourished.  Vascular Dorsalis pedis pulses palpable bilaterally. Posterior tibial pulses palpable bilaterally. Capillary refill normal to all digits.  No cyanosis or clubbing noted. Pedal hair growth normal.  Neurologic Normal speech. Oriented to person, place, and time. Epicritic sensation to light touch grossly present bilaterally.  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  No pinpoint bleeding noted upon debridement  Orthopedic: Normal joint ROM without pain or crepitus bilaterally. No visible deformities. No bony tenderness.   Radiographs: None Assessment:   1. Porokeratosis    Plan:  Patient was evaluated and treated and all questions answered.  Left submetatarsal 5 porokeratosis - All questions or concerns were discussed with the patient in extensive detail given the amount of pain that he is experiencing a benefit from debridement of the lesion using  chisel blade handle the lesion was debrided down to healthy stripe tissue with no complication noted no pinpoint bleeding noted.  No follow-ups on file.    [1]  Current Outpatient Medications:    finasteride  (PROSCAR ) 5 MG tablet, TAKE 1 TABLET EVERY DAY, Disp: 90 tablet, Rfl: 0   Na Sulfate-K Sulfate-Mg Sulfate concentrate (SUPREP) 17.5-3.13-1.6 GM/177ML SOLN, SMARTSIG:1 Kit(s) By Mouth Once, Disp: , Rfl:    tamsulosin  (FLOMAX ) 0.4 MG CAPS capsule, TAKE 2 CAPSULES EVERY DAY, Disp: 180 capsule, Rfl: 3 [2]  Social History Tobacco Use  Smoking Status Never   Passive exposure: Never  Smokeless Tobacco Never  [3]  Allergies Allergen Reactions   Penicillins Other (See Comments)    Syncope

## 2024-02-11 ENCOUNTER — Encounter: Payer: Self-pay | Admitting: Nurse Practitioner

## 2024-02-11 ENCOUNTER — Ambulatory Visit: Payer: Medicare PPO | Admitting: Nurse Practitioner

## 2024-02-11 ENCOUNTER — Ambulatory Visit: Attending: Nurse Practitioner

## 2024-02-11 VITALS — BP 110/60 | HR 44 | Temp 97.7°F | Ht 73.0 in | Wt 218.4 lb

## 2024-02-11 DIAGNOSIS — E663 Overweight: Secondary | ICD-10-CM

## 2024-02-11 DIAGNOSIS — R001 Bradycardia, unspecified: Secondary | ICD-10-CM

## 2024-02-11 DIAGNOSIS — Z0000 Encounter for general adult medical examination without abnormal findings: Secondary | ICD-10-CM | POA: Diagnosis not present

## 2024-02-11 DIAGNOSIS — Z125 Encounter for screening for malignant neoplasm of prostate: Secondary | ICD-10-CM

## 2024-02-11 DIAGNOSIS — I440 Atrioventricular block, first degree: Secondary | ICD-10-CM | POA: Diagnosis not present

## 2024-02-11 DIAGNOSIS — R42 Dizziness and giddiness: Secondary | ICD-10-CM | POA: Insufficient documentation

## 2024-02-11 DIAGNOSIS — Z13220 Encounter for screening for lipoid disorders: Secondary | ICD-10-CM | POA: Diagnosis not present

## 2024-02-11 DIAGNOSIS — R351 Nocturia: Secondary | ICD-10-CM | POA: Diagnosis not present

## 2024-02-11 DIAGNOSIS — R0989 Other specified symptoms and signs involving the circulatory and respiratory systems: Secondary | ICD-10-CM

## 2024-02-11 LAB — CBC WITH DIFFERENTIAL/PLATELET
Basophils Absolute: 0 K/uL (ref 0.0–0.1)
Basophils Relative: 0.5 % (ref 0.0–3.0)
Eosinophils Absolute: 0.1 K/uL (ref 0.0–0.7)
Eosinophils Relative: 2.9 % (ref 0.0–5.0)
HCT: 44.3 % (ref 39.0–52.0)
Hemoglobin: 15.3 g/dL (ref 13.0–17.0)
Lymphocytes Relative: 22.7 % (ref 12.0–46.0)
Lymphs Abs: 0.9 K/uL (ref 0.7–4.0)
MCHC: 34.6 g/dL (ref 30.0–36.0)
MCV: 94.7 fl (ref 78.0–100.0)
Monocytes Absolute: 0.4 K/uL (ref 0.1–1.0)
Monocytes Relative: 11 % (ref 3.0–12.0)
Neutro Abs: 2.4 K/uL (ref 1.4–7.7)
Neutrophils Relative %: 62.9 % (ref 43.0–77.0)
Platelets: 168 K/uL (ref 150.0–400.0)
RBC: 4.68 Mil/uL (ref 4.22–5.81)
RDW: 13.2 % (ref 11.5–15.5)
WBC: 3.9 K/uL — ABNORMAL LOW (ref 4.0–10.5)

## 2024-02-11 LAB — COMPREHENSIVE METABOLIC PANEL WITH GFR
ALT: 26 U/L (ref 0–53)
AST: 23 U/L (ref 0–37)
Albumin: 4.3 g/dL (ref 3.5–5.2)
Alkaline Phosphatase: 85 U/L (ref 39–117)
BUN: 20 mg/dL (ref 6–23)
CO2: 30 meq/L (ref 19–32)
Calcium: 9.4 mg/dL (ref 8.4–10.5)
Chloride: 105 meq/L (ref 96–112)
Creatinine, Ser: 0.97 mg/dL (ref 0.40–1.50)
GFR: 79.01 mL/min (ref >60.00)
Glucose, Bld: 90 mg/dL (ref 70–99)
Potassium: 4.3 meq/L (ref 3.5–5.1)
Sodium: 140 meq/L (ref 135–145)
Total Bilirubin: 0.7 mg/dL (ref 0.2–1.2)
Total Protein: 6.2 g/dL (ref 6.0–8.3)

## 2024-02-11 LAB — LIPID PANEL
Cholesterol: 103 mg/dL (ref 0–200)
HDL: 40.2 mg/dL (ref >39.00)
LDL Cholesterol: 54 mg/dL (ref 0–99)
NonHDL: 62.75
Total CHOL/HDL Ratio: 3
Triglycerides: 44 mg/dL (ref 0.0–149.0)
VLDL: 8.8 mg/dL (ref 0.0–40.0)

## 2024-02-11 LAB — PSA, MEDICARE: PSA: 0.51 ng/mL (ref 0.10–4.00)

## 2024-02-11 LAB — TSH: TSH: 4.04 u[IU]/mL (ref 0.35–5.50)

## 2024-02-11 NOTE — Assessment & Plan Note (Signed)
 Incidental finding on EKG will order at home heart monitor for 2 weeks to make sure patient is having no high grade AV block or symptomatic bradycardia.

## 2024-02-11 NOTE — Patient Instructions (Signed)
 Nice to see you today  I will be in touch with the labs and other tests as I get them  Follow up with me in a year, sooner if you need me   Call and schedule the Ultrasound at the following  [facility] - off [facility] [address] [place] , [place] [address]  [phone] Open 8am-5pm

## 2024-02-11 NOTE — Assessment & Plan Note (Signed)
 History of the same currently contained on Proscar  5 mg and tamsulosin  0.8 mg daily.  Pending PSA.  Patient is having good flow and a decrease in nocturia.  Continue

## 2024-02-11 NOTE — Assessment & Plan Note (Signed)
Pending TSH, lipid panel.

## 2024-02-11 NOTE — Progress Notes (Signed)
 Established Patient Office Visit  Subjective   Patient ID: Walter Washington, male    DOB: 1956-02-24  Age: 68 y.o. MRN: [id number]  Chief Complaint  Patient presents with   Annual Exam    HPI  BPH: patient is currently on finasteride  and flomax . States that he has good flow. He does have some noctuira  for complete physical and follow up of chronic conditions.  Immunizations: -Tetanus: Completed in 2024 -Influenza: up to date at pharmacy  -Shingles: Completed Shingrix first vaccine?. States that he has had both -Pneumonia: Completed 2020  Diet: Fair diet. He is eating 3 meals a day. He will rarely snack. He iwll do coffee water and some time ice  Exercise: No regular exercise. He does a lot of walking and jogging. Just did a 5K at thanksgiving and 1 mile race   Eye exam:  yearly  Dental exam: Completes semi-annually    Colonoscopy: Scheduled on 03/09/2024 Lung Cancer Screening: NA   PSA: Due  Sleep: going to bed around 12 and will get up aroud 9. Feels rested most times. States that he will wake up sometimes.   Advanced directive: does have one   States that he is having some dizziness when he bends over  for a minute or so. States that he will stand back up and then he is fine       Review of Systems  Constitutional:  Negative for chills and fever.  Respiratory:  Negative for shortness of breath.   Cardiovascular:  Negative for chest pain and leg swelling.  Gastrointestinal:  Negative for abdominal pain, blood in stool, constipation, diarrhea, nausea and vomiting.       BM daily   Genitourinary:  Negative for dysuria and hematuria.  Neurological:  Positive for dizziness. Negative for tingling and headaches.  Psychiatric/Behavioral:  Negative for hallucinations and suicidal ideas.       Objective:     BP 110/60   Pulse (!) 44   Temp 97.7 F (36.5 C) (Oral)   Ht 6' 1 (1.854 m)   Wt 218 lb 6.4 oz (99.1 kg)   SpO2 96%   BMI 28.81 kg/m  BP Readings from  Last 3 Encounters:  02/11/24 110/60  01/16/24 120/62  02/07/23 108/72   Wt Readings from Last 3 Encounters:  02/11/24 218 lb 6.4 oz (99.1 kg)  01/16/24 223 lb 12.8 oz (101.5 kg)  02/07/23 234 lb (106.1 kg)   SpO2 Readings from Last 3 Encounters:  02/11/24 96%  01/16/24 97%  02/07/23 94%      Physical Exam Vitals and nursing note reviewed.  Constitutional:      Appearance: Normal appearance.  HENT:     Right Ear: Tympanic membrane, ear canal and external ear normal.     Left Ear: Tympanic membrane, ear canal and external ear normal.     Mouth/Throat:     Mouth: Mucous membranes are moist.     Pharynx: Oropharynx is clear.  Eyes:     Extraocular Movements: Extraocular movements intact.     Pupils: Pupils are equal, round, and reactive to light.  Neck:     Vascular: Carotid bruit (left sided) present.  Cardiovascular:     Rate and Rhythm: Normal rate and regular rhythm.     Pulses: Normal pulses.     Heart sounds: Normal heart sounds.  Pulmonary:     Effort: Pulmonary effort is normal.     Breath sounds: Normal breath sounds.  Abdominal:  General: Bowel sounds are normal. There is no distension.     Palpations: There is no mass.     Tenderness: There is no abdominal tenderness.     Hernia: No hernia is present.  Musculoskeletal:     Right lower leg: No edema.     Left lower leg: No edema.  Lymphadenopathy:     Cervical: No cervical adenopathy.  Skin:    General: Skin is warm.  Neurological:     General: No focal deficit present.     Mental Status: He is alert.     Deep Tendon Reflexes:     Reflex Scores:      Bicep reflexes are 2+ on the right side and 2+ on the left side.      Patellar reflexes are 2+ on the right side and 2+ on the left side.    Comments: Bilateral upper and lower extremity strength 5/5  Psychiatric:        Mood and Affect: Mood normal.        Behavior: Behavior normal.        Thought Content: Thought content normal.        Judgment:  Judgment normal.      No results found for any visits on 02/11/24.    The ASCVD Risk score (Arnett DK, et al., 2019) failed to calculate for the following reasons:   The valid total cholesterol range is 130 to 320 mg/dL    Assessment & Plan:   Problem List Items Addressed This Visit       Cardiovascular and Mediastinum   First degree AV block   Incidental finding on EKG will order at home heart monitor for 2 weeks to make sure patient is having no high grade AV block or symptomatic bradycardia.      Relevant Orders   LONG TERM MONITOR XT (3-14 DAYS)     Other   Preventative health care - Primary   Discussed age-appropriate immunizations and screening exams.  Did review patient's personal, surgical, social, family histories.  Patient is up-to-date with all age-appropriate vaccinations he would like.  Patient is up-to-date on CRC screening.  Will obtain PSA today.  Patient was given information at discharge about preventative healthcare maintenance with anticipatory guidance      Relevant Orders   CBC with Differential/Platelet   Comprehensive metabolic panel with GFR   TSH   Overweight   Pending TSH, lipid panel      Nocturia   History of the same currently contained on Proscar  5 mg and tamsulosin  0.8 mg daily.  Pending PSA.  Patient is having good flow and a decrease in nocturia.  Continue      Dizziness   Recent over the past year.  Patient has a history of bradycardia slower today than normal EKG in office.  EKG showed first-degree AV block.  Will do at home heart monitor to make sure no high-grade AV blocks or symptomatic bradycardia.  Patient also has left-sided carotid bruit we will obtain ultrasound carotid bilateral.  Patient was given information to call and set up at his convenience.  Pending results consider cardiology referral if needed      Relevant Orders   CBC with Differential/Platelet   Comprehensive metabolic panel with GFR   TSH   EKG 12-Lead   US   Carotid Duplex Bilateral   LONG TERM MONITOR XT (3-14 DAYS)   Other Visit Diagnoses       Bruit of left carotid artery  Relevant Orders   Lipid panel   US  Carotid Duplex Bilateral     Bradycardia       Relevant Orders   CBC with Differential/Platelet   Comprehensive metabolic panel with GFR   TSH   EKG 12-Lead   LONG TERM MONITOR XT (3-14 DAYS)     Screening for prostate cancer       Relevant Orders   PSA, Medicare     Screening for lipid disorders       Relevant Orders   Lipid panel       Return in about 1 year (around 02/10/2025) for CPE and Labs.    Adina Crandall, NP

## 2024-02-11 NOTE — Assessment & Plan Note (Signed)
 Recent over the past year.  Patient has a history of bradycardia slower today than normal EKG in office.  EKG showed first-degree AV block.  Will do at home heart monitor to make sure no high-grade AV blocks or symptomatic bradycardia.  Patient also has left-sided carotid bruit we will obtain ultrasound carotid bilateral.  Patient was given information to call and set up at his convenience.  Pending results consider cardiology referral if needed

## 2024-02-11 NOTE — Assessment & Plan Note (Signed)
 Discussed age-appropriate immunizations and screening exams.  Did review patient's personal, surgical, social, family histories.  Patient is up-to-date with all age-appropriate vaccinations he would like.  Patient is up-to-date on CRC screening.  Will obtain PSA today.  Patient was given information at discharge about preventative healthcare maintenance with anticipatory guidance

## 2024-02-12 ENCOUNTER — Ambulatory Visit
Admission: RE | Admit: 2024-02-12 | Discharge: 2024-02-12 | Disposition: A | Discharge status: Home / Self Care | Source: Ambulatory Visit | Attending: Nurse Practitioner | Admitting: Nurse Practitioner

## 2024-02-12 ENCOUNTER — Ambulatory Visit: Payer: Self-pay | Admitting: Nurse Practitioner

## 2024-02-12 DIAGNOSIS — R0989 Other specified symptoms and signs involving the circulatory and respiratory systems: Secondary | ICD-10-CM | POA: Insufficient documentation

## 2024-02-12 DIAGNOSIS — R42 Dizziness and giddiness: Secondary | ICD-10-CM | POA: Insufficient documentation

## 2024-02-16 ENCOUNTER — Other Ambulatory Visit: Payer: Self-pay | Admitting: Nurse Practitioner

## 2024-02-16 DIAGNOSIS — R351 Nocturia: Secondary | ICD-10-CM

## 2024-03-08 ENCOUNTER — Encounter: Payer: Self-pay | Admitting: Gastroenterology

## 2024-03-09 ENCOUNTER — Ambulatory Visit
Admission: RE | Admit: 2024-03-09 | Discharge: 2024-03-09 | Disposition: A | Discharge status: Home / Self Care | Attending: Gastroenterology | Admitting: Gastroenterology

## 2024-03-09 ENCOUNTER — Encounter
Admission: RE | Disposition: A | Discharge status: Home / Self Care | Payer: Self-pay | Source: Home / Self Care | Attending: Gastroenterology

## 2024-03-09 ENCOUNTER — Ambulatory Visit: Admitting: Anesthesiology

## 2024-03-09 ENCOUNTER — Encounter: Payer: Self-pay | Admitting: Gastroenterology

## 2024-03-09 ENCOUNTER — Other Ambulatory Visit: Payer: Self-pay

## 2024-03-09 DIAGNOSIS — K635 Polyp of colon: Secondary | ICD-10-CM | POA: Diagnosis not present

## 2024-03-09 DIAGNOSIS — K5730 Diverticulosis of large intestine without perforation or abscess without bleeding: Secondary | ICD-10-CM | POA: Insufficient documentation

## 2024-03-09 DIAGNOSIS — D128 Benign neoplasm of rectum: Secondary | ICD-10-CM | POA: Diagnosis not present

## 2024-03-09 DIAGNOSIS — Z1211 Encounter for screening for malignant neoplasm of colon: Secondary | ICD-10-CM | POA: Insufficient documentation

## 2024-03-09 HISTORY — PX: POLYPECTOMY: SHX149

## 2024-03-09 HISTORY — PX: COLONOSCOPY: SHX5424

## 2024-03-09 SURGERY — COLONOSCOPY
Anesthesia: Monitor Anesthesia Care

## 2024-03-09 MED ORDER — GLYCOPYRROLATE 0.2 MG/ML IJ SOLN
INTRAMUSCULAR | Status: DC | PRN
  Administered 2024-03-09: .2 mg via INTRAVENOUS

## 2024-03-09 MED ORDER — PROPOFOL 10 MG/ML IV BOLUS
INTRAVENOUS | Status: DC | PRN
  Administered 2024-03-09 (×2): 40 mg via INTRAVENOUS

## 2024-03-09 MED ORDER — LIDOCAINE HCL (PF) 2 % IJ SOLN
INTRAMUSCULAR | Status: AC
  Filled 2024-03-09: qty 5

## 2024-03-09 MED ORDER — GLYCOPYRROLATE 0.2 MG/ML IJ SOLN
INTRAMUSCULAR | Status: AC
  Filled 2024-03-09: qty 1

## 2024-03-09 MED ORDER — EPHEDRINE SULFATE-NACL 50-0.9 MG/10ML-% IV SOSY
PREFILLED_SYRINGE | INTRAVENOUS | Status: DC | PRN
  Administered 2024-03-09: 10 mg via INTRAVENOUS

## 2024-03-09 MED ORDER — SODIUM CHLORIDE 0.9 % IV SOLN: INTRAVENOUS | Status: DC

## 2024-03-09 MED ORDER — PROPOFOL 500 MG/50ML IV EMUL
INTRAVENOUS | Status: DC | PRN
  Administered 2024-03-09: 75 ug/kg/min via INTRAVENOUS

## 2024-03-09 MED ORDER — DEXMEDETOMIDINE HCL IN NACL 80 MCG/20ML IV SOLN
INTRAVENOUS | Status: DC | PRN
  Administered 2024-03-09 (×2): 8 ug via INTRAVENOUS
  Administered 2024-03-09: 4 ug via INTRAVENOUS

## 2024-03-09 MED ORDER — LIDOCAINE HCL (CARDIAC) PF 100 MG/5ML IV SOSY
PREFILLED_SYRINGE | INTRAVENOUS | Status: DC | PRN
  Administered 2024-03-09: 80 mg via INTRAVENOUS

## 2024-03-09 NOTE — Anesthesia Postprocedure Evaluation (Signed)
"   Anesthesia Post Note  Patient: Walter Washington  Procedure(s) Performed: COLONOSCOPY POLYPECTOMY, INTESTINE  Patient location during evaluation: PACU Anesthesia Type: MAC Level of consciousness: awake and alert Pain management: pain level controlled Vital Signs Assessment: post-procedure vital signs reviewed and stable Respiratory status: spontaneous breathing, nonlabored ventilation, respiratory function stable and patient connected to nasal cannula oxygen Cardiovascular status: blood pressure returned to baseline and stable Postop Assessment: no apparent nausea or vomiting Anesthetic complications: no   No notable events documented.   Last Vitals:  Vitals:   03/09/24 0928 03/09/24 1037  BP: 138/75 (!) 87/64  Pulse: (!) 46   Resp: 18   Temp: (!) 36.3 C (!) 36.3 C  SpO2: 97% 97%    Last Pain:  Vitals:   03/09/24 1037  TempSrc:   PainSc: 0-No pain                 Redell MARLA Breaker      "

## 2024-03-09 NOTE — Anesthesia Preprocedure Evaluation (Signed)
"                                    Anesthesia Evaluation  Patient identified by MRN, date of birth, ID band Patient awake    Reviewed: Allergy & Precautions, H&P , NPO status , Patient's Chart, lab work & pertinent test results  Airway Mallampati: II  TM Distance: >3 FB Neck ROM: Full    Dental no notable dental hx.    Pulmonary neg pulmonary ROS   Pulmonary exam normal breath sounds clear to auscultation       Cardiovascular Normal cardiovascular exam+ dysrhythmias I Rhythm:Regular Rate:Normal     Neuro/Psych negative neurological ROS  negative psych ROS   GI/Hepatic negative GI ROS, Neg liver ROS,,,  Endo/Other  negative endocrine ROS    Renal/GU negative Renal ROS  negative genitourinary   Musculoskeletal negative musculoskeletal ROS (+)    Abdominal   Peds negative pediatric ROS (+)  Hematology negative hematology ROS (+)   Anesthesia Other Findings   Reproductive/Obstetrics negative OB ROS                              Anesthesia Physical Anesthesia Plan  ASA: 2  Anesthesia Plan: MAC and General   Post-op Pain Management: Minimal or no pain anticipated   Induction: Intravenous  PONV Risk Score and Plan: 0  Airway Management Planned: Mask  Additional Equipment:   Intra-op Plan:   Post-operative Plan:   Informed Consent: I have reviewed the patients History and Physical, chart, labs and discussed the procedure including the risks, benefits and alternatives for the proposed anesthesia with the patient or authorized representative who has indicated his/her understanding and acceptance.     Dental advisory given  Plan Discussed with: CRNA  Anesthesia Plan Comments:         Anesthesia Quick Evaluation  "

## 2024-03-09 NOTE — Op Note (Signed)
 [facility] Gastroenterology Patient Name: Walter Washington Procedure Date: 03/09/2024 9:53 AM MRN: [id number] Account #: [id number] Date of Birth: May 29, 1955 Admit Type: Outpatient Age: 69 Room: [facility] ENDO ROOM 4 Gender: Male Note Status: Finalized Instrument Name: Colon Scope [id number] Procedure:             Colonoscopy Indications:           Screening for colorectal malignant neoplasm Providers:             Rogelia Copping MD, MD Referring MD:          Lynwood HERO. Cable (Referring MD) Medicines:             Propofol  per Anesthesia Complications:         No immediate complications. Procedure:             Pre-Anesthesia Assessment:                        - Prior to the procedure, a History and Physical was                         performed, and patient medications and allergies were                         reviewed. The patient's tolerance of previous                         anesthesia was also reviewed. The risks and benefits                         of the procedure and the sedation options and risks                         were discussed with the patient. All questions were                         answered, and informed consent was obtained. Prior                         Anticoagulants: The patient has taken no anticoagulant                         or antiplatelet agents. ASA Grade Assessment: II - A                         patient with mild systemic disease. After reviewing                         the risks and benefits, the patient was deemed in                         satisfactory condition to undergo the procedure.                        After obtaining informed consent, the colonoscope was                         passed under direct vision. Throughout the procedure,  the patient's blood pressure, pulse, and oxygen                         saturations were monitored continuously. The                         Colonoscope was introduced through the  anus and                         advanced to the the cecum, identified by appendiceal                         orifice and ileocecal valve. The colonoscopy was                         performed without difficulty. The patient tolerated                         the procedure well. The quality of the bowel                         preparation was excellent. Findings:      The perianal and digital rectal examinations were normal.      A 3 mm polyp was found in the ascending colon. The polyp was sessile.       The polyp was removed with a cold snare. Resection and retrieval were       complete.      A 3 mm polyp was found in the rectum. The polyp was sessile. The polyp       was removed with a cold snare. Resection and retrieval were complete.      Multiple small-mouthed diverticula were found in the sigmoid colon. Impression:            - One 3 mm polyp in the ascending colon, removed with                         a cold snare. Resected and retrieved.                        - One 3 mm polyp in the rectum, removed with a cold                         snare. Resected and retrieved.                        - Diverticulosis in the sigmoid colon. Recommendation:        - Discharge patient to home.                        - Resume previous diet.                        - Continue present medications.                        - Await pathology results. Procedure Code(s):     --- Professional ---  54614, Colonoscopy, flexible; with removal of                         tumor(s), polyp(s), or other lesion(s) by snare                         technique Diagnosis Code(s):     --- Professional ---                        Z12.11, Encounter for screening for malignant neoplasm                         of colon                        D12.8, Benign neoplasm of rectum CPT copyright 2022 American Medical Association. All rights reserved. The codes documented in this report are preliminary and upon  coder review may  be revised to meet current compliance requirements. Rogelia Copping MD, MD 03/09/2024 10:33:20 AM This report has been signed electronically. Number of Addenda: 0 Note Initiated On: 03/09/2024 9:53 AM Scope Withdrawal Time: 0 hours 14 minutes 4 seconds  Total Procedure Duration: 0 hours 22 minutes 23 seconds  Estimated Blood Loss:  Estimated blood loss: none.      [facility]

## 2024-03-09 NOTE — H&P (Signed)
 "  Walter Copping, MD [facility] [address] [place], [place] [address] Phone: [phone] Fax : [phone]  Primary Care Physician:  Wendee Lynwood HERO, NP Primary Gastroenterologist:  Dr. Copping  Pre-Procedure History & Physical: HPI:  Walter Washington is a 69 y.o. male is here for a screening colonoscopy.   Past Medical History:  Diagnosis Date   Allergy 1963   Allergy to Penicillian   Arthritis Feet and Knee   2010   Cataract 2018   Implants have corrected issue    Past Surgical History:  Procedure Laterality Date   CATARACT EXTRACTION     CHOLECYSTECTOMY     EYE SURGERY     JOINT REPLACEMENT  knee   2016   REPLACEMENT TOTAL KNEE Left    TONSILLECTOMY     TONSILLECTOMY AND ADENOIDECTOMY      Prior to Admission medications  Medication Sig Start Date End Date Taking? Authorizing Provider  finasteride  (PROSCAR ) 5 MG tablet TAKE 1 TABLET EVERY DAY 02/17/24  Yes Wendee Lynwood HERO, NP  tamsulosin  (FLOMAX ) 0.4 MG CAPS capsule TAKE 2 CAPSULES EVERY DAY 08/11/23  Yes Wendee Lynwood HERO, NP  Na Sulfate-K Sulfate-Mg Sulfate concentrate (SUPREP) 17.5-3.13-1.6 GM/177ML SOLN SMARTSIG:1 Kit(s) By Mouth Once 12/31/23   [provider]    Allergies as of 12/31/2023 - Review Complete 02/07/2023  Allergen Reaction Noted   Penicillins Other (See Comments) 07/29/2017    Family History  Problem Relation Age of Onset   Liver disease Mother    Diabetes Mother    COPD Father    Alcohol abuse Father    Arthritis Maternal Grandmother     Social History   Socioeconomic History   Marital status: Married    Spouse name: Beverley   Number of children: 2   Years of education: Not on file   Highest education level: Bachelor's degree (e.g., BA, AB, BS)  Occupational History   Not on file  Tobacco Use   Smoking status: Never    Passive exposure: Never   Smokeless tobacco: Never  Vaping Use   Vaping status: Never Used  Substance and Sexual Activity   Alcohol use: Never    Comment:  occ.   Drug use: Never   Sexual activity: Yes    Birth control/protection: None  Other Topics Concern   Not on file  Social History Narrative   Walter Washington, [place]      IT guy mostly retired       Hobbies: golf, piano   Social Drivers of Health   Tobacco Use: Low Risk (03/09/2024)   Patient History    Smoking Tobacco Use: Never    Smokeless Tobacco Use: Never    Passive Exposure: Never  Financial Resource Strain: Low Risk (12/17/2023)   Overall Financial Resource Strain (CARDIA)    Difficulty of Paying Living Expenses: Not hard at all  Food Insecurity: No Food Insecurity (12/17/2023)   Epic    Worried About Programme Researcher, Broadcasting/film/video in the Last Year: Never true    Ran Out of Food in the Last Year: Never true  Transportation Needs: No Transportation Needs (12/17/2023)   Epic    Lack of Transportation (Medical): No    Lack of Transportation (Non-Medical): No  Physical Activity: Sufficiently Active (12/17/2023)   Exercise Vital Sign    Days of Exercise per Week: 5 days    Minutes of Exercise per Session: 60 min  Stress: No Stress Concern Present (12/17/2023)   [employer]  of Occupational Health - Occupational Stress Questionnaire    Feeling of Stress: Not at all  Social Connections: Moderately Integrated (12/17/2023)   Social Connection and Isolation Panel    Frequency of Communication with Friends and Family: Three times a week    Frequency of Social Gatherings with Friends and Family: Once a week    Attends Religious Services: 1 to 4 times per year    Active Member of [employer] or Organizations: No    Attends Engineer, Structural: Not on file    Marital Status: Married  Catering Manager Violence: Not on file  Depression (PHQ2-9): Low Risk (02/11/2024)   Depression (PHQ2-9)    PHQ-2 Score: 0  Alcohol Screen: Low Risk (12/17/2023)   Alcohol Screen    Last Alcohol Screening Score (AUDIT): 1  Housing: Low Risk (12/17/2023)   Epic    Unable to Pay for Housing  in the Last Year: No    Number of Times Moved in the Last Year: 1    Homeless in the Last Year: No  Utilities: Not on file  Health Literacy: Not on file    Review of Systems: See HPI, otherwise negative ROS  Physical Exam: BP 138/75   Pulse (!) 46   Temp (!) 97.4 F (36.3 C) (Temporal)   Resp 18   Ht 6' 1 (1.854 m)   Wt 97.8 kg   SpO2 97%   BMI 28.44 kg/m  General:   Alert,  pleasant and cooperative in NAD Head:  Normocephalic and atraumatic. Neck:  Supple; no masses or thyromegaly. Lungs:  Clear throughout to auscultation.    Heart:  Regular rate and rhythm. Abdomen:  Soft, nontender and nondistended. Normal bowel sounds, without guarding, and without rebound.   Neurologic:  Alert and  oriented x4;  grossly normal neurologically.  Impression/Plan: Walter Washington is now here to undergo a screening colonoscopy.  Risks, benefits, and alternatives regarding colonoscopy have been reviewed with the patient.  Questions have been answered.  All parties agreeable. "

## 2024-03-09 NOTE — Transfer of Care (Signed)
 Immediate Anesthesia Transfer of Care Note  Patient: Walter Washington  Procedure(s) Performed: COLONOSCOPY POLYPECTOMY, INTESTINE  Patient Location: PACU  Anesthesia Type:General  Level of Consciousness: sedated  Airway & Oxygen Therapy: Patient Spontanous Breathing  Post-op Assessment: Report given to RN and Post -op Vital signs reviewed and stable  Post vital signs: Reviewed and stable  Last Vitals:  Vitals Value Taken Time  BP    Temp    Pulse    Resp 17 03/09/24 10:37  SpO2    Vitals shown include unfiled device data.  Last Pain:  Vitals:   03/09/24 0928  TempSrc: Temporal  PainSc: 0-No pain         Complications: No notable events documented.

## 2024-03-10 ENCOUNTER — Ambulatory Visit: Payer: Self-pay | Admitting: Gastroenterology

## 2024-03-10 LAB — SURGICAL PATHOLOGY

## 2024-03-12 ENCOUNTER — Encounter: Payer: Self-pay | Admitting: Nurse Practitioner

## 2024-03-12 ENCOUNTER — Ambulatory Visit

## 2024-03-14 ENCOUNTER — Other Ambulatory Visit: Payer: Self-pay | Admitting: Cardiology

## 2024-03-14 DIAGNOSIS — R42 Dizziness and giddiness: Secondary | ICD-10-CM

## 2024-03-14 DIAGNOSIS — I440 Atrioventricular block, first degree: Secondary | ICD-10-CM

## 2024-03-14 DIAGNOSIS — R001 Bradycardia, unspecified: Secondary | ICD-10-CM | POA: Diagnosis not present

## 2024-03-14 DIAGNOSIS — R9431 Abnormal electrocardiogram [ECG] [EKG]: Secondary | ICD-10-CM

## 2024-03-15 ENCOUNTER — Ambulatory Visit: Attending: Cardiology | Admitting: Cardiology

## 2024-03-15 ENCOUNTER — Encounter: Payer: Self-pay | Admitting: Cardiology

## 2024-03-15 VITALS — BP 102/74 | HR 51 | Ht 73.0 in | Wt 225.1 lb

## 2024-03-15 DIAGNOSIS — I4729 Other ventricular tachycardia: Secondary | ICD-10-CM | POA: Diagnosis not present

## 2024-03-15 DIAGNOSIS — R42 Dizziness and giddiness: Secondary | ICD-10-CM | POA: Diagnosis not present

## 2024-03-15 DIAGNOSIS — R001 Bradycardia, unspecified: Secondary | ICD-10-CM | POA: Diagnosis not present

## 2024-03-15 NOTE — Progress Notes (Signed)
 " Cardiology Office Note   Date:  03/15/2024  ID:  Walter Washington, DOB 1955-05-15, MRN [id number] PCP: Wendee Lynwood HERO, NP  [facility] Providers Cardiologist:  Emeline FORBES Calender, DO  History of Present Illness Walter Washington is a 69 y.o. male with a past medical history of dizziness. Presents today for evaluation of bradycardia   Patient was seen by their primary care provider on 02/11/24. At that time, patient reported having dizziness when he bent over for a minute or so. Felt well when he stood up. Had an EKG that showed bradycardia with 1st degree AV block- HR 41 BPM. A cardiac monitor was ordered and showed predominantly NSR with an average HR of 54 BPM, 2 episodes of SVT with the longest lasting 9 beats, 4 episodes of NSVT with the longest lasting 12 beats. There were 2 pauses with the longest lasting 3 seconds. Both occurred in early AM hours.   Today, patient presents due to his abnormal cardiac monitor results. He tells me that he saw his primary care provider back in December for an annual physical. At that time, he mentioned having episodes of dizziness if he was crouching down/bending over and stood up too quickly. This happened in the summer when he was outside gardening. His PCP ordered a heart monitor, which he was told was abnormal. He tells me that he has been feeling well overall and does not have any complaints. He has a resting HR in the 40s-50s. Tells me that this has been his heart rate his whole life. He has always been a runner and he continues to jog and walk regularly. Denies any syncope, near syncope, fatigue. Denies chest pain or shortness of breath. Denies palpitations. He is able to go from laying>sitting>standing without difficulty. He does not snore and denies daytime somnolence.   Studies Reviewed EKG Interpretation Date/Time:  Monday March 15 2024 11:06:28 EST Ventricular Rate:  52 PR Interval:  184 QRS Duration:  84 QT Interval:  398 QTC  Calculation: 370 R Axis:   7  Text Interpretation: Sinus bradycardia No previous ECGs available Confirmed by Vicci Sauer [phone]) on 03/15/2024 11:34:29 AM   Cardiac Studies & Procedures   ______________________________________________________________________________________________        SHERRILEE  LONG TERM MONITOR (3-14 DAYS) 03/10/2024  Narrative Patch Wear Time:  14 days and 0 hours (2025-12-15T10:16:50-0500 to 2025-12-29T10:16:50-0500)  HR 21 - 164, average 54 bpm. 2 nonsustained SVT (longest 9 beats) and 4 nonsustained VT (longest 12 beats). 2 pauses occurred, longest 3 seconds. Both occurring in early morning hours. Rare supraventricular ectopy. Rare ventricular ectopy. No sustained arrhythmias. Symptom trigger episodes correspond to sinus rhythm with rare ectopy, interpretation limited by artifact.   Cardiology referral placed based on protocol.   [facility]       ______________________________________________________________________________________________       Risk Assessment/Calculations           Physical Exam VS:  BP 102/74   Pulse (!) 51   Ht 6' 1 (1.854 m)   Wt 225 lb 1.3 oz (102.1 kg)   SpO2 98%   BMI 29.70 kg/m        Wt Readings from Last 3 Encounters:  03/15/24 225 lb 1.3 oz (102.1 kg)  03/09/24 215 lb 9.6 oz (97.8 kg)  02/11/24 218 lb 6.4 oz (99.1 kg)    GEN: Well nourished, well developed in no acute distress. Sitting comfortably on the exam table  NECK: No JVD  CARDIAC:  RRR, no murmurs, rubs, gallops. Radial pulses 2+ bilaterally  RESPIRATORY:  Clear to auscultation without rales, wheezing or rhonchi. Normal WOB on room air   ABDOMEN: Soft, non-tender, non-distended EXTREMITIES:  No edema; No deformity   ASSESSMENT AND PLAN  Bradycardia  - Patient has a resting HR in the 40s-50s. On monitor, his Average HR was 54 BPM. Slowest HR was 21 BPM. There were 2 pauses (longest 3 seconds), both during  sleeping hours  - Patient denies syncope, near syncope, fatigue. He has always been a runner and continues to job and walk regularly - Discussed possible sleep study. Patient does not snore. Does not have history of hypertension. Does not have any daytime fatigue. OK to hold off on sleep study for now  - Not on AV nodal medications. TSH normal in 02/2024 - As patient is asymptomatic, no indication for PPM at this time   SVT NSVT  - Cardiac monitor showed 2 episodes of SVT (longest lasting 9 beats), and 4 nonsustained VT (longest lasing 12 beats). These were asymptomatic  - On 12/10- K 4.3, hemoglobin 15.3, TSH normal  - Ordered echocardiogram  - Ordered coronary CTA to rule out CAD given NSVT on monitor. Ordered BMP prior to scan  - No BB or CCB given bradycardia    Dispo: Follow up with Dr. Kriste in 3 months   Signed, Rollo FABIENE Louder, PA-C   "

## 2024-03-15 NOTE — Patient Instructions (Addendum)
 Medication Instructions:  Your physician recommends that you continue on your current medications as directed. Please refer to the Current Medication list given to you today.  *If you need a refill on your cardiac medications before your next appointment, please call your pharmacy*  Lab Work: TODAY:  BMET  If you have labs (blood work) drawn today and your tests are completely normal, you will receive your results only by: MyChart Message (if you have MyChart) OR A paper copy in the mail If you have any lab test that is abnormal or we need to change your treatment, we will call you to review the results.  Testing/Procedures: Your physician has requested that you have cardiac CT. Cardiac computed tomography (CT) is a painless test that uses an x-ray machine to take clear, detailed pictures of your heart. For further information please visit [email or web address] Please follow instruction sheet BELOW:    Your cardiac CT will be scheduled at one of the below locations:    [facility]. [facility] [address]  [place], [place] [address]  03/26/24 ARRIVE AT 3:00 P.M.    If scheduled at the Heart and Vascular Tower at [address], please enter the parking lot using the [address] entrance and use the FREE valet service at the patient drop-off area. Enter the building and check-in with registration on the main floor.   Please follow these instructions carefully (unless otherwise directed):  An IV will be required for this test and Nitroglycerin will be given.  Hold all erectile dysfunction medications at least 3 days (72 hrs) prior to test. (Ie viagra, cialis, sildenafil, tadalafil, etc)   On the Night Before the Test: Be sure to Drink plenty of water. Do not consume any caffeinated/decaffeinated beverages or chocolate 12 hours prior to your test. Do not take any antihistamines 12 hours prior to your test.   On the Day of the Test: Drink plenty of water  until 1 hour prior to the test. Do not eat any food 1 hour prior to test. You may take your regular medications prior to the test.    After the Test: Drink plenty of water. After receiving IV contrast, you may experience a mild flushed feeling. This is normal. On occasion, you may experience a mild rash up to 24 hours after the test. This is not dangerous. If this occurs, you can take Benadryl 25 mg, Zyrtec, Claritin, or Allegra and increase your fluid intake. (Patients taking Tikosyn should avoid Benadryl, and may take Zyrtec, Claritin, or Allegra) If you experience trouble breathing, this can be serious. If it is severe call 911 IMMEDIATELY. If it is mild, please call our office.  We will call to schedule your test 2-4 weeks out understanding that some insurance companies will need an authorization prior to the service being performed.   For more information and frequently asked questions, please visit our website : [email or web address]  For non-scheduling related questions, please contact the cardiac imaging nurse navigator should you have any questions/concerns: Cardiac Imaging Nurse Navigators Direct Office Dial: [phone]   For scheduling needs, including cancellations and rescheduling, please call Brittany, [phone].   Follow-Up: At [facility], you and your health needs are our priority.  As part of our continuing mission to provide you with exceptional heart care, our providers are all part of one team.  This team includes your primary Cardiologist (physician) and Advanced Practice Providers or APPs (Physician Assistants and Nurse Practitioners) who all work together to provide  you with the care you need, when you need it.  Your next appointment:   3 month(s)  Provider:   Emeline FORBES Calender, DO    We recommend signing up for the patient portal called MyChart.  Sign up information is provided on this After Visit Summary.  MyChart is used to connect  with patients for Virtual Visits (Telemedicine).  Patients are able to view lab/test results, encounter notes, upcoming appointments, etc.  Non-urgent messages can be sent to your provider as well.   To learn more about what you can do with MyChart, go to [email or web address].   Other Instructions

## 2024-03-16 ENCOUNTER — Ambulatory Visit (HOSPITAL_COMMUNITY)
Admission: RE | Admit: 2024-03-16 | Discharge: 2024-03-16 | Disposition: A | Discharge status: Home / Self Care | Source: Ambulatory Visit | Attending: Cardiology | Admitting: Cardiology

## 2024-03-16 ENCOUNTER — Ambulatory Visit: Payer: Self-pay | Admitting: Cardiology

## 2024-03-16 DIAGNOSIS — I440 Atrioventricular block, first degree: Secondary | ICD-10-CM | POA: Diagnosis not present

## 2024-03-16 DIAGNOSIS — Z8249 Family history of ischemic heart disease and other diseases of the circulatory system: Secondary | ICD-10-CM | POA: Diagnosis not present

## 2024-03-16 DIAGNOSIS — I080 Rheumatic disorders of both mitral and aortic valves: Secondary | ICD-10-CM | POA: Diagnosis not present

## 2024-03-16 DIAGNOSIS — I4729 Other ventricular tachycardia: Secondary | ICD-10-CM | POA: Diagnosis present

## 2024-03-16 LAB — ECHOCARDIOGRAM COMPLETE
Area-P 1/2: 3.16 cm2
P 1/2 time: 465 ms
S' Lateral: 3.3 cm

## 2024-03-16 LAB — BASIC METABOLIC PANEL WITH GFR
BUN/Creatinine Ratio: 15 (ref 10–24)
BUN: 14 mg/dL (ref 8–27)
CO2: 22 mmol/L (ref 20–29)
Calcium: 9.5 mg/dL (ref 8.6–10.2)
Chloride: 104 mmol/L (ref 96–106)
Creatinine, Ser: 0.93 mg/dL (ref 0.76–1.27)
Glucose: 81 mg/dL (ref 70–99)
Potassium: 4.8 mmol/L (ref 3.5–5.2)
Sodium: 140 mmol/L (ref 134–144)
eGFR: 88 mL/min/1.73

## 2024-03-24 ENCOUNTER — Encounter (HOSPITAL_COMMUNITY): Payer: Self-pay

## 2024-03-26 ENCOUNTER — Ambulatory Visit (HOSPITAL_COMMUNITY)
Admission: RE | Admit: 2024-03-26 | Discharge: 2024-03-26 | Disposition: A | Source: Ambulatory Visit | Attending: Cardiology

## 2024-03-26 DIAGNOSIS — I4729 Other ventricular tachycardia: Secondary | ICD-10-CM | POA: Insufficient documentation

## 2024-03-26 DIAGNOSIS — I2510 Atherosclerotic heart disease of native coronary artery without angina pectoris: Secondary | ICD-10-CM | POA: Diagnosis not present

## 2024-03-26 DIAGNOSIS — R079 Chest pain, unspecified: Secondary | ICD-10-CM | POA: Diagnosis not present

## 2024-03-26 DIAGNOSIS — I77810 Thoracic aortic ectasia: Secondary | ICD-10-CM | POA: Diagnosis not present

## 2024-03-26 DIAGNOSIS — R001 Bradycardia, unspecified: Secondary | ICD-10-CM

## 2024-03-26 DIAGNOSIS — R42 Dizziness and giddiness: Secondary | ICD-10-CM | POA: Insufficient documentation

## 2024-03-26 DIAGNOSIS — R918 Other nonspecific abnormal finding of lung field: Secondary | ICD-10-CM | POA: Diagnosis not present

## 2024-03-26 MED ORDER — NITROGLYCERIN 0.4 MG SL SUBL
0.8000 mg | SUBLINGUAL_TABLET | Freq: Once | SUBLINGUAL | Status: AC
  Administered 2024-03-26: 0.8 mg via SUBLINGUAL

## 2024-03-26 MED ORDER — IOHEXOL 350 MG/ML SOLN
100.0000 mL | Freq: Once | INTRAVENOUS | Status: AC | PRN
  Administered 2024-03-26: 100 mL via INTRAVENOUS

## 2024-03-30 ENCOUNTER — Other Ambulatory Visit: Payer: Self-pay

## 2024-03-30 ENCOUNTER — Telehealth: Payer: Self-pay

## 2024-03-30 MED ORDER — ROSUVASTATIN CALCIUM 10 MG PO TABS: 10.0000 mg | ORAL_TABLET | Freq: Every day | ORAL | 0 refills | Status: DC

## 2024-03-30 MED ORDER — ROSUVASTATIN CALCIUM 10 MG PO TABS: 10.0000 mg | ORAL_TABLET | Freq: Every day | ORAL | 1 refills | Status: AC

## 2024-03-30 NOTE — Telephone Encounter (Signed)
 Spoke with pt regarding starting Crestor  10 mg daily per Rollo. Pt requested a one month supply be sent to his local pharmacy, [facility] in [place], and the remainder be sent to [facility] (mail order). Medication sent per pt request.

## 2024-06-15 ENCOUNTER — Ambulatory Visit: Admitting: Internal Medicine

## 2025-02-11 ENCOUNTER — Encounter: Admitting: Nurse Practitioner
